# Patient Record
Sex: Male | Born: 1960
Health system: Southern US, Community
[De-identification: ages and names within clinical notes are randomized; demographics above are authoritative.]

## PROBLEM LIST (undated history)

## (undated) DIAGNOSIS — H269 Unspecified cataract: Secondary | ICD-10-CM

## (undated) DIAGNOSIS — E785 Hyperlipidemia, unspecified: Secondary | ICD-10-CM

## (undated) DIAGNOSIS — I10 Essential (primary) hypertension: Secondary | ICD-10-CM

## (undated) DIAGNOSIS — K219 Gastro-esophageal reflux disease without esophagitis: Secondary | ICD-10-CM

## (undated) HISTORY — PX: COLONOSCOPY: SHX174

## (undated) HISTORY — DX: Unspecified cataract: H26.9

## (undated) HISTORY — DX: Gastro-esophageal reflux disease without esophagitis: K21.9

## (undated) HISTORY — DX: Essential (primary) hypertension: I10

## (undated) HISTORY — PX: POLYPECTOMY: SHX149

## (undated) HISTORY — DX: Hyperlipidemia, unspecified: E78.5

---
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
## 1987-09-16 HISTORY — PX: WISDOM TOOTH EXTRACTION: SHX21

## 2005-02-09 ENCOUNTER — Emergency Department (HOSPITAL_COMMUNITY): Admission: EM | Admit: 2005-02-09 | Discharge: 2005-02-10 | Payer: Self-pay | Admitting: Emergency Medicine

## 2005-07-17 ENCOUNTER — Encounter: Payer: Self-pay | Admitting: Internal Medicine

## 2005-07-28 ENCOUNTER — Encounter: Payer: Self-pay | Admitting: Internal Medicine

## 2006-02-14 ENCOUNTER — Emergency Department: Payer: Self-pay | Admitting: Emergency Medicine

## 2006-02-14 ENCOUNTER — Other Ambulatory Visit: Payer: Self-pay

## 2006-02-17 ENCOUNTER — Emergency Department (HOSPITAL_COMMUNITY): Admission: EM | Admit: 2006-02-17 | Discharge: 2006-02-17 | Payer: Self-pay | Admitting: Family Medicine

## 2006-03-24 ENCOUNTER — Emergency Department (HOSPITAL_COMMUNITY): Admission: EM | Admit: 2006-03-24 | Discharge: 2006-03-24 | Payer: Self-pay | Admitting: Emergency Medicine

## 2006-06-11 ENCOUNTER — Ambulatory Visit: Payer: Self-pay | Admitting: Internal Medicine

## 2006-09-01 ENCOUNTER — Emergency Department (HOSPITAL_COMMUNITY): Admission: EM | Admit: 2006-09-01 | Discharge: 2006-09-01 | Payer: Self-pay | Admitting: Family Medicine

## 2006-09-15 ENCOUNTER — Emergency Department (HOSPITAL_COMMUNITY): Admission: EM | Admit: 2006-09-15 | Discharge: 2006-09-15 | Payer: Self-pay | Admitting: Family Medicine

## 2007-01-09 ENCOUNTER — Emergency Department (HOSPITAL_COMMUNITY): Admission: EM | Admit: 2007-01-09 | Discharge: 2007-01-09 | Payer: Self-pay | Admitting: Emergency Medicine

## 2007-06-30 ENCOUNTER — Emergency Department: Payer: Self-pay | Admitting: Emergency Medicine

## 2007-06-30 ENCOUNTER — Other Ambulatory Visit: Payer: Self-pay

## 2007-07-19 ENCOUNTER — Encounter (INDEPENDENT_AMBULATORY_CARE_PROVIDER_SITE_OTHER): Payer: Self-pay | Admitting: *Deleted

## 2007-07-20 ENCOUNTER — Ambulatory Visit: Payer: Self-pay | Admitting: Internal Medicine

## 2007-07-20 DIAGNOSIS — K219 Gastro-esophageal reflux disease without esophagitis: Secondary | ICD-10-CM

## 2007-07-20 DIAGNOSIS — I10 Essential (primary) hypertension: Secondary | ICD-10-CM | POA: Insufficient documentation

## 2007-07-20 DIAGNOSIS — E1140 Type 2 diabetes mellitus with diabetic neuropathy, unspecified: Secondary | ICD-10-CM | POA: Insufficient documentation

## 2007-07-20 DIAGNOSIS — E119 Type 2 diabetes mellitus without complications: Secondary | ICD-10-CM

## 2007-07-21 LAB — CONVERTED CEMR LAB
BUN: 11 mg/dL (ref 6–23)
Basophils Absolute: 0 10*3/uL (ref 0.0–0.1)
Calcium: 10.3 mg/dL (ref 8.4–10.5)
Cholesterol: 212 mg/dL (ref 0–200)
Creatinine,U: 93.6 mg/dL
Direct LDL: 147.2 mg/dL
Eosinophils Absolute: 0 10*3/uL (ref 0.0–0.6)
GFR calc Af Amer: 117 mL/min
HDL: 34.4 mg/dL — ABNORMAL LOW (ref >39.0)
Lymphocytes Relative: 40 % (ref 12.0–46.0)
MCHC: 33.6 g/dL (ref 30.0–36.0)
MCV: 94.7 fL (ref 78.0–100.0)
Microalb, Ur: 0.3 mg/dL (ref 0.0–1.9)
Monocytes Relative: 8.1 % (ref 3.0–11.0)
Neutro Abs: 2.4 10*3/uL (ref 1.4–7.7)
PSA: 0.84 ng/mL (ref 0.10–4.00)
Phosphorus: 3.5 mg/dL (ref 2.3–4.6)
Platelets: 262 10*3/uL (ref 150–400)
Potassium: 3.8 meq/L (ref 3.5–5.1)
RBC: 4.93 M/uL (ref 4.22–5.81)
Triglycerides: 186 mg/dL — ABNORMAL HIGH (ref 0–149)

## 2008-01-18 ENCOUNTER — Ambulatory Visit: Payer: Self-pay | Admitting: Internal Medicine

## 2008-01-18 DIAGNOSIS — E785 Hyperlipidemia, unspecified: Secondary | ICD-10-CM

## 2008-01-18 DIAGNOSIS — E782 Mixed hyperlipidemia: Secondary | ICD-10-CM | POA: Insufficient documentation

## 2008-02-08 ENCOUNTER — Ambulatory Visit: Payer: Self-pay | Admitting: Internal Medicine

## 2008-02-08 ENCOUNTER — Telehealth (INDEPENDENT_AMBULATORY_CARE_PROVIDER_SITE_OTHER): Payer: Self-pay | Admitting: *Deleted

## 2008-02-09 LAB — CONVERTED CEMR LAB
AST: 19 units/L (ref 0–37)
Albumin: 4.1 g/dL (ref 3.5–5.2)
Alkaline Phosphatase: 65 units/L (ref 39–117)
CO2: 29 meq/L (ref 19–32)
Calcium: 9.4 mg/dL (ref 8.4–10.5)
Cholesterol: 153 mg/dL (ref 0–200)
Creatinine, Ser: 0.9 mg/dL (ref 0.4–1.5)
GFR calc Af Amer: 117 mL/min
GFR calc non Af Amer: 97 mL/min
HDL: 27.6 mg/dL — ABNORMAL LOW (ref >39.0)
Hgb A1c MFr Bld: 5.8 % (ref 4.6–6.0)
LDL Cholesterol: 96 mg/dL (ref 0–99)
Sodium: 141 meq/L (ref 135–145)
Total CHOL/HDL Ratio: 5.5
Total Protein: 6.6 g/dL (ref 6.0–8.3)
Triglycerides: 149 mg/dL (ref 0–149)

## 2008-03-21 ENCOUNTER — Encounter: Payer: Self-pay | Admitting: Internal Medicine

## 2008-06-14 ENCOUNTER — Ambulatory Visit: Payer: Self-pay | Admitting: Family Medicine

## 2008-08-21 ENCOUNTER — Ambulatory Visit: Payer: Self-pay | Admitting: Internal Medicine

## 2008-08-22 LAB — CONVERTED CEMR LAB
ALT: 20 units/L (ref 0–53)
AST: 21 units/L (ref 0–37)
Alkaline Phosphatase: 64 units/L (ref 39–117)
BUN: 9 mg/dL (ref 6–23)
Basophils Absolute: 0 10*3/uL (ref 0.0–0.1)
Bilirubin, Direct: 0.1 mg/dL (ref 0.0–0.3)
CO2: 31 meq/L (ref 19–32)
Calcium: 9.6 mg/dL (ref 8.4–10.5)
Creatinine,U: 42.7 mg/dL
Glucose, Bld: 123 mg/dL — ABNORMAL HIGH (ref 70–99)
Hgb A1c MFr Bld: 5.9 % (ref 4.6–6.0)
Lymphocytes Relative: 42 % (ref 12.0–46.0)
MCHC: 33.8 g/dL (ref 30.0–36.0)
Monocytes Relative: 5.8 % (ref 3.0–12.0)
Neutrophils Relative %: 50.6 % (ref 43.0–77.0)
PSA: 0.8 ng/mL (ref 0.10–4.00)
Platelets: 241 10*3/uL (ref 150–400)
RDW: 12.3 % (ref 11.5–14.6)
Sodium: 139 meq/L (ref 135–145)
TSH: 0.83 microintl units/mL (ref 0.35–5.50)
Total Bilirubin: 0.7 mg/dL (ref 0.3–1.2)

## 2009-02-26 ENCOUNTER — Ambulatory Visit: Payer: Self-pay | Admitting: Internal Medicine

## 2009-02-26 LAB — CONVERTED CEMR LAB
Albumin: 4.2 g/dL (ref 3.5–5.2)
BUN: 14 mg/dL (ref 6–23)
Glucose, Bld: 138 mg/dL — ABNORMAL HIGH (ref 70–99)
Hgb A1c MFr Bld: 5.9 % (ref 4.6–6.5)
Phosphorus: 3.4 mg/dL (ref 2.3–4.6)
Potassium: 3.7 meq/L (ref 3.5–5.1)

## 2009-08-28 ENCOUNTER — Encounter: Payer: Self-pay | Admitting: Internal Medicine

## 2009-08-29 ENCOUNTER — Ambulatory Visit: Payer: Self-pay | Admitting: Internal Medicine

## 2009-08-31 LAB — CONVERTED CEMR LAB
Albumin: 4.4 g/dL (ref 3.5–5.2)
Chloride: 98 meq/L (ref 96–112)
Cholesterol: 178 mg/dL (ref 0–200)
Creatinine,U: 84.9 mg/dL
Eosinophils Relative: 0.7 % (ref 0.0–5.0)
GFR calc non Af Amer: 115.92 mL/min (ref >60)
HCT: 47.5 % (ref 39.0–52.0)
Hemoglobin: 16.3 g/dL (ref 13.0–17.0)
LDL Cholesterol: 116 mg/dL — ABNORMAL HIGH (ref 0–99)
Lymphs Abs: 2 10*3/uL (ref 0.7–4.0)
Monocytes Relative: 6.1 % (ref 3.0–12.0)
Neutro Abs: 2.9 10*3/uL (ref 1.4–7.7)
Potassium: 3.4 meq/L — ABNORMAL LOW (ref 3.5–5.1)
RBC: 5.05 M/uL (ref 4.22–5.81)
TSH: 0.76 microintl units/mL (ref 0.35–5.50)
Total Protein: 7.6 g/dL (ref 6.0–8.3)
Triglycerides: 123 mg/dL (ref 0.0–149.0)
VLDL: 24.6 mg/dL (ref 0.0–40.0)
WBC: 5.2 10*3/uL (ref 4.5–10.5)

## 2009-09-17 ENCOUNTER — Telehealth: Payer: Self-pay | Admitting: Internal Medicine

## 2010-03-05 ENCOUNTER — Ambulatory Visit: Payer: Self-pay | Admitting: Internal Medicine

## 2010-08-16 ENCOUNTER — Telehealth: Payer: Self-pay | Admitting: Internal Medicine

## 2010-08-30 ENCOUNTER — Ambulatory Visit: Payer: Self-pay | Admitting: Internal Medicine

## 2010-09-06 ENCOUNTER — Ambulatory Visit: Payer: Self-pay | Admitting: Internal Medicine

## 2010-09-10 LAB — CONVERTED CEMR LAB
ALT: 19 units/L (ref 0–53)
AST: 19 units/L (ref 0–37)
Albumin: 4.2 g/dL (ref 3.5–5.2)
Alkaline Phosphatase: 63 units/L (ref 39–117)
Basophils Relative: 0.4 % (ref 0.0–3.0)
Chloride: 100 meq/L (ref 96–112)
Cholesterol: 172 mg/dL (ref 0–200)
Creatinine,U: 64.5 mg/dL
Eosinophils Relative: 0.8 % (ref 0.0–5.0)
GFR calc non Af Amer: 130.34 mL/min (ref >60.00)
Hemoglobin: 16 g/dL (ref 13.0–17.0)
LDL Cholesterol: 117 mg/dL — ABNORMAL HIGH (ref 0–99)
Lymphocytes Relative: 41.4 % (ref 12.0–46.0)
MCHC: 34.6 g/dL (ref 30.0–36.0)
Microalb, Ur: 0.2 mg/dL (ref 0.0–1.9)
Monocytes Relative: 7.1 % (ref 3.0–12.0)
Neutro Abs: 2.8 10*3/uL (ref 1.4–7.7)
PSA: 0.81 ng/mL (ref 0.10–4.00)
Phosphorus: 3.6 mg/dL (ref 2.3–4.6)
Potassium: 3.9 meq/L (ref 3.5–5.1)
RBC: 4.86 M/uL (ref 4.22–5.81)
TSH: 0.67 microintl units/mL (ref 0.35–5.50)
Total Protein: 7 g/dL (ref 6.0–8.3)

## 2010-10-02 ENCOUNTER — Ambulatory Visit
Admission: RE | Admit: 2010-10-02 | Discharge: 2010-10-02 | Payer: Self-pay | Source: Home / Self Care | Attending: Family Medicine | Admitting: Family Medicine

## 2010-10-02 DIAGNOSIS — R51 Headache: Secondary | ICD-10-CM | POA: Insufficient documentation

## 2010-10-02 DIAGNOSIS — R519 Headache, unspecified: Secondary | ICD-10-CM | POA: Insufficient documentation
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
## 2010-10-15 NOTE — Progress Notes (Signed)
Summary: felt weak  Phone Note Call from Patient   Caller: Patient Summary of Call: Pt is in [place], said he had episode of feeling weak and nervous.  He is asking if he needs to be seen can he go to [facility], says he is right there at it.  I advised him no, because he is not a pt there.  If an emergency situation he can, but they would probably send him across the street to the hospital.  He says he is feeling better now, says he thought he just needed to eat something.  He will discuss this with you at his next office visit. Initial call taken by: Lowella Petties CMA, AAMA,  August 16, 2010 12:12 PM  Follow-up for Phone Call        okay  Please call him on Monday and make sure he is still feeling okay Cindee Salt MD  August 16, 2010 1:10 PM   left message on machine at home for patient to return my call.  DeShannon Smith CMA Duncan Dull)  August 19, 2010 9:37 AM   patient still not calling back, I will close the phone call and wait for pt to call. Follow-up by: Mervin Hack CMA (AAMA),  August 21, 2010 1:00 PM

## 2010-10-15 NOTE — Progress Notes (Signed)
Summary: Wants med refilled thru [facility]  Phone Note Call from Patient Call back at [phone]   Caller: Patient Call For: Cindee Salt MD Summary of Call: Pt had CPX one month ago and spoke with [facility] about getting set up with [facility] to get his meds refilled. Pt would like for [facility] to call him back at [phone] to talk about refilling his meds thru [facility]. Pt understands that Geraldine Contras is not in the office today and pt  is not out of med and said he would wait to hear back from [place]. Thank you. Initial call taken by: Lewanda Rife LPN,  September 17, 2009 9:51 AM  Follow-up for Phone Call        pt would like for all refills to be sent to [facility], which I have done. Follow-up by: Mervin Hack CMA Duncan Dull),  September 18, 2009 12:26 PM    Prescriptions: Koren Bound TEST   STRP (GLUCOSE BLOOD) check daily or as directed  #300 x 3   Entered by:   Mervin Hack CMA (AAMA)   Authorized by:   Cindee Salt MD   Signed by:   Mervin Hack CMA (AAMA) on 09/18/2009   Method used:   Electronically to        [facility] (mail-order)             ,          Ph: [phone]       Fax: [phone]   RxID:   [id number] PRILOSEC OTC 20 MG  TBEC (OMEPRAZOLE MAGNESIUM) Take 1 tablet by mouth once a day as needed  #90 x 3   Entered by:   Mervin Hack CMA (AAMA)   Authorized by:   Cindee Salt MD   Signed by:   Mervin Hack CMA (AAMA) on 09/18/2009   Method used:   Electronically to        [facility] (mail-order)             ,          Ph: [phone]       Fax: [phone]   RxID:   [id number] LISINOPRIL 40 MG  TABS (LISINOPRIL) Take 1 tablet by mouth once a day  #90 x 3   Entered by:   Mervin Hack CMA (AAMA)   Authorized by:   Cindee Salt MD   Signed by:   Mervin Hack CMA (AAMA) on 09/18/2009   Method used:   Electronically to        [facility]* (mail-order)             ,          Ph: [phone]       Fax: [phone]   RxID:    [id number] AMLODIPINE BESYLATE 10 MG TABS (AMLODIPINE BESYLATE) Take 1 tablet by mouth once a day  #90 x 3   Entered by:   Mervin Hack CMA (AAMA)   Authorized by:   Cindee Salt MD   Signed by:   Mervin Hack CMA (AAMA) on 09/18/2009   Method used:   Electronically to        [facility] (mail-order)             ,          Ph: [phone]       Fax: [phone]   RxID:   [id number] HYDROCHLOROTHIAZIDE 25 MG TABS (HYDROCHLOROTHIAZIDE) Take 1 tablet by mouth  once a day  #90 x 3   Entered by:   Mervin Hack CMA (AAMA)   Authorized by:   Cindee Salt MD   Signed by:   Mervin Hack CMA (AAMA) on 09/18/2009   Method used:   Electronically to        [facility]* (mail-order)             ,          Ph: [phone]       Fax: [phone]   RxID:   [id number]
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
## 2010-10-17 NOTE — Assessment & Plan Note (Signed)
Summary: cpx   Vital Signs:  Patient profile:   50 year old male Height:      75 inches Weight:      231 pounds Temp:     98.6 degrees F oral Pulse rate:   62 / minute Pulse rhythm:   regular BP sitting:   130 / 90  (left arm) Cuff size:   large  Vitals Entered By: Mervin Hack CMA Duncan Dull) (September 06, 2010 9:52 AM) CC: ADULT PHYSICAL   History of Present Illness: DOing fairly well BP generally okay---usually 120/80 didn't take meds today yet---fasting for labs  Checks sugars several times per week lately has been up to 110-120  due for eye exam  Is very attentive to small body symptoms Occ "jumping" feeling in left low back goes away on its own  Concerned about kidneys occ urine smells  Has had some mild headaches in the past few weeks every 2-3 days mostly in 1 temple---more left than right aspirin helps Mostly evening No neck pain  Allergies: No Known Drug Allergies  Past History:  Past medical, surgical, family and social histories (including risk factors) reviewed for relevance to current acute and chronic problems.  Past Medical History: Reviewed history from 01/18/2008 and no changes required. Diabetes mellitus, type II GERD Hypertension Hyperlipidemia  Past Surgical History: Reviewed history from 07/20/2007 and no changes required. Stress MRI---no ischemia, mild LVH   11/06  Family History: Reviewed history from 07/20/2007 and no changes required. 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 Eyes:  Denies double vision and vision loss-1 eye. ENT:  Denies decreased hearing and ringing in ears; teeth fine---regular with dentist. CV:  Complains of shortness of breath with exertion; denies chest pain or discomfort, difficulty breathing at night, difficulty breathing while lying down, fainting, lightheadness, and palpitations; Occ DOE walking fast and trying to talk. Resp:  Denies cough and shortness of breath. GI:  Complains of indigestion; denies abdominal pain, bloody stools, change in bowel habits, dark tarry stools, nausea, and vomiting; occ heartburn---needs OTC meds at times. GU:  Denies erectile dysfunction, urinary frequency, and urinary hesitancy. MS:  Complains of muscle aches; denies joint pain and joint swelling; occ stiffness in AM. Derm:  Denies lesion(s) and rash. Neuro:  Complains of headaches; denies numbness, tingling, and weakness. Psych:  Denies anxiety and depression; some stress--esp dealing with rental home. Heme:  Denies abnormal bruising and enlarge lymph nodes. Allergy:  Denies sneezing.  Physical Exam  General:  alert and normal appearance.   Eyes:  pupils equal, pupils round, pupils reactive to light, and no optic disk abnormalities.   Ears:  R ear normal and L ear normal.   Mouth:  no erythema, no exudates, and no lesions.   Neck:  supple, no masses, no thyromegaly, no carotid bruits, and no cervical lymphadenopathy.   Lungs:  normal respiratory effort, no intercostal retractions, no accessory muscle use, and normal breath sounds.   Heart:  normal rate, regular rhythm, no murmur, and no gallop.  Abdomen:  soft, non-tender, and no masses.   Msk:  no joint tenderness and no joint swelling.   Pulses:  1+ in feet Extremities:  no edema Neurologic:  alert & oriented X3, strength normal in all extremities, and gait normal.   Skin:  no suspicious lesions and no ulcerations.   Axillary Nodes:   No palpable lymphadenopathy Psych:  normally interactive, good eye contact, not anxious appearing, and not depressed appearing.    Diabetes Management Exam:    Foot Exam (with socks and/or shoes not present):       Sensory-Pinprick/Light touch:          Left medial foot (L-4): normal          Left dorsal foot (L-5): normal          Left lateral foot (S-1): normal          Right medial foot (L-4): normal          Right dorsal foot (L-5): normal          Right lateral foot (S-1): normal       Inspection:          Left foot: normal          Right foot: normal       Nails:          Left foot: normal          Right foot: normal   Impression & Recommendations:  Problem # 1:  PREVENTIVE HEALTH CARE (ICD-V70.0) Assessment Comment Only discussed walking more frequently will check PSA after discussion colon at 50  Problem # 2:  DIABETES MELLITUS, TYPE II (ICD-250.00) Assessment: Deteriorated  but still sounds okay will check labs  His updated medication list for this problem includes:    Lisinopril 40 Mg Tabs (Lisinopril) .Marland Kitchen... Take 1 tablet by mouth once a day  Labs Reviewed: Creat: 0.9 (08/29/2009)     Last Eye Exam: normal (05/16/2008) Reviewed HgBA1c results: 6.2 (03/05/2010)  6.0 (08/29/2009)  Orders: TLB-A1C / Hgb A1C (Glycohemoglobin) (83036-A1C) TLB-Microalbumin/Creat Ratio, Urine (82043-MALB)  Problem # 3:  HYPERTENSION (ICD-401.9) Assessment: Unchanged  fair control didn't take meds today  His updated medication list for this problem includes:    Hydrochlorothiazide 25 Mg Tabs (Hydrochlorothiazide) .Marland Kitchen... Take 1 tablet by mouth once a day    Amlodipine Besylate 10 Mg Tabs (Amlodipine besylate) .Marland Kitchen... Take 1 tablet by mouth once a day    Lisinopril 40 Mg Tabs (Lisinopril) .Marland Kitchen... Take 1 tablet by mouth once a day  BP today: 130/90 Prior BP: 148/88 (03/05/2010)  Labs Reviewed: K+: 3.4 (08/29/2009) Creat: : 0.9 (08/29/2009)   Chol: 178 (08/29/2009)   HDL:  37.80 (08/29/2009)   LDL: 116 (08/29/2009)   TG: 123.0 (08/29/2009)  Orders: TLB-Renal Function Panel (80069-RENAL) TLB-CBC Platelet - w/Differential (85025-CBCD) TLB-Hepatic/Liver Function Pnl (80076-HEPATIC) TLB-TSH (Thyroid Stimulating Hormone) (84443-TSH)  Problem # 4:  HYPERLIPIDEMIA (ICD-272.4) Assessment: Unchanged  check labs no Rx for now but should consider if LDL >130  Labs Reviewed: SGOT: 22 (08/29/2009)   SGPT: 22 (08/29/2009)   HDL:37.80 (08/29/2009), 27.6 (02/08/2008)  LDL:116 (08/29/2009), 96 (19/14/7829)  Chol:178 (08/29/2009), 153 (02/08/2008)  Trig:123.0 (08/29/2009), 149 (02/08/2008)  Orders: TLB-Lipid Panel (80061-LIPID) Venipuncture (56213)  Problem # 5:  GERD (ICD-530.81) Assessment: Unchanged  His updated medication list for this problem includes:    Prilosec Otc 20 Mg Tbec (Omeprazole magnesium) .Marland Kitchen... Take 1 tablet by mouth once a day as needed  Complete Medication List: 1)  Hydrochlorothiazide 25 Mg Tabs (Hydrochlorothiazide) .... Take 1 tablet by mouth once a day 2)  Amlodipine Besylate 10 Mg Tabs (Amlodipine besylate) .... Take 1 tablet by mouth once a day 3)  Lisinopril 40 Mg Tabs (Lisinopril) .... Take 1 tablet by mouth once a day 4)  Prilosec Otc 20 Mg Tbec (Omeprazole magnesium) .... Take 1 tablet by mouth once a day as needed 5)  Onetouch Ultra Test Strp (Glucose blood) .... Check daily or as directed  Other Orders: TLB-PSA (Prostate Specific Antigen) (84153-PSA)  Patient Instructions: 1)  Please schedule a follow-up appointment in 6 months .  Prescriptions: LISINOPRIL 40 MG  TABS (LISINOPRIL) Take 1 tablet by mouth once a day  #90 x 3   Entered by:   Mervin Hack CMA (AAMA)   Authorized by:   Cindee Salt MD   Signed by:   Mervin Hack CMA (AAMA) on 09/06/2010   Method used:   Electronically to        [facility] (retail)             ,          Ph: [phone]       Fax: [phone]   RxID:    [id number] AMLODIPINE BESYLATE 10 MG TABS (AMLODIPINE BESYLATE) Take 1 tablet by mouth once a day  #90 x 3   Entered by:   Mervin Hack CMA (AAMA)   Authorized by:   Cindee Salt MD   Signed by:   Mervin Hack CMA (AAMA) on 09/06/2010   Method used:   Electronically to        [facility] (retail)             ,          Ph: [phone]       Fax: [phone]   RxID:   [id number] HYDROCHLOROTHIAZIDE 25 MG TABS (HYDROCHLOROTHIAZIDE) Take 1 tablet by mouth once a day  #90 x 3   Entered by:   Mervin Hack CMA (AAMA)   Authorized by:   Cindee Salt MD   Signed by:   Mervin Hack CMA (AAMA) on 09/06/2010   Method used:   Electronically to        [facility]* (retail)             ,          Ph: [phone]       Fax: [phone]   RxID:   [id number]    Orders Added: 1)  Est. Patient 40-64 years [99396] 2)  TLB-A1C / Hgb A1C (Glycohemoglobin) [83036-A1C] 3)  TLB-Microalbumin/Creat Ratio, Urine [82043-MALB] 4)  TLB-PSA (Prostate Specific Antigen) [84153-PSA] 5)  TLB-Lipid Panel [80061-LIPID] 6)  Venipuncture [36415] 7)  TLB-Renal Function Panel [80069-RENAL] 8)  TLB-CBC Platelet - w/Differential [85025-CBCD] 9)  TLB-Hepatic/Liver Function Pnl [80076-HEPATIC] 10)  TLB-TSH (Thyroid Stimulating Hormone) [25427-CWC]    Current Allergies (reviewed today): No known allergies

## 2010-11-08 ENCOUNTER — Ambulatory Visit: Payer: Self-pay | Admitting: Internal Medicine

## 2011-01-31 NOTE — Assessment & Plan Note (Signed)
[facility]                                   ON-CALL NOTE   LENNIS, KORB                        MRN:          [id number]  DATE:06/22/2006                            DOB:          05/18/1961    IDENTIFICATION:  Phone dictation on Mr. Sweetin, date of birth November 04, 1960, one of my patients, phone number [phone].  Phone call on June 22, 2006, at about 1835 hours.   SUBJECTIVE:  Mr. Branscome calls, says there was a mix-up, did not get his  prescriptions phoned into [facility] and he is right now for his refills.  I do  not remember him.  I think he is a new patient.  The medications he needed  were lisinopril 40, hydrochlorothiazide 25 and Norvasc 10.   PLAN:  I did call the pharmacist at [facility], and okayed 1 month refills on  each of those, and will straighten out any further medications during  regular business hours.            ______________________________  Karie Schwalbe, MD      RIL/MedQ  DD:  06/22/2006  DT:  06/24/2006  Job #:  [id number]

## 2011-01-31 NOTE — Assessment & Plan Note (Signed)
[facility]                                 ON-CALL NOTE   JABIER, DEESE                          MRN:          [id number]  DATE:01/09/2007                            DOB:          [birth year]    The patient states that he has had some left upper back pain for about 2  weeks but it was worse this morning without specific injury, otherwise  chest pain, shortness of breath, fever, or cough but was concerned that  should be checked out.  I have offered office visit in [place] office this  morning, however patient states that he uses [place] as secondary and he  usually sees Dr. Milus Glazier at [facility] so he will be seen  at [facility] this morning.     Neta Mends. Panosh, MD  Electronically Signed    WKP/MedQ  DD: 01/09/2007  DT: 01/09/2007  Job #: [id number]

## 2011-03-07 ENCOUNTER — Ambulatory Visit: Payer: Self-pay | Admitting: Internal Medicine

## 2011-03-12 ENCOUNTER — Encounter: Payer: Self-pay | Admitting: Internal Medicine

## 2011-03-14 ENCOUNTER — Ambulatory Visit (INDEPENDENT_AMBULATORY_CARE_PROVIDER_SITE_OTHER): Payer: 59 | Admitting: Internal Medicine

## 2011-03-14 ENCOUNTER — Encounter: Payer: Self-pay | Admitting: Internal Medicine

## 2011-03-14 VITALS — BP 122/80 | HR 85 | Temp 98.6°F | Ht 74.5 in | Wt 223.0 lb

## 2011-03-14 DIAGNOSIS — K219 Gastro-esophageal reflux disease without esophagitis: Secondary | ICD-10-CM

## 2011-03-14 DIAGNOSIS — I10 Essential (primary) hypertension: Secondary | ICD-10-CM

## 2011-03-14 DIAGNOSIS — E119 Type 2 diabetes mellitus without complications: Secondary | ICD-10-CM

## 2011-03-14 DIAGNOSIS — E785 Hyperlipidemia, unspecified: Secondary | ICD-10-CM

## 2011-03-14 LAB — BASIC METABOLIC PANEL
Calcium: 9.7 mg/dL (ref 8.4–10.5)
GFR: 153.92 mL/min (ref >60.00)
Potassium: 3.9 mEq/L (ref 3.5–5.1)
Sodium: 141 mEq/L (ref 135–145)

## 2011-03-14 LAB — LIPID PANEL
LDL Cholesterol: 116 mg/dL — ABNORMAL HIGH (ref 0–99)
VLDL: 28.4 mg/dL (ref 0.0–40.0)

## 2011-03-14 NOTE — Assessment & Plan Note (Signed)
BP Readings from Last 3 Encounters:  03/14/11 122/80  10/02/10 112/78  09/06/10 130/90   Good control No changes needed

## 2011-03-14 NOTE — Progress Notes (Signed)
Subjective:    Patient ID: Hunter Richmond, male    DOB: 01-27-1961, 50 y.o.   MRN: [id number]  HPI Stress has died down with sister in law---no longer a dangerous situation No headaches any more  Has not been exercising lately Related to long hours at work---trying to change job if possible. 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 amLODipine (NORVASC) 10 MG tablet Take 10 mg by mouth daily.        Marland Kitchen glucose blood (ONE TOUCH ULTRA TEST) test strip 1 each daily as needed. Use as instructed       . hydrochlorothiazide 25 MG tablet Take 25 mg by mouth daily.        Marland Kitchen lisinopril (PRINIVIL,ZESTRIL) 40 MG tablet Take 40 mg by mouth daily.        Marland Kitchen omeprazole (PRILOSEC) 20 MG capsule Take 20 mg by mouth daily.          No Known Allergies  Past Medical History  Diagnosis Date  . Diabetes mellitus   . GERD (gastroesophageal reflux disease)   . Hypertension   . Hyperlipidemia     No past surgical history on file.  Family History  Problem Relation Age of Onset  . Diabetes Brother   . Cancer Maternal Aunt     breast cancer  . Cancer Maternal Uncle     throat  . Heart disease Neg Hx   . Diabetes Sister     History   Social History  . Marital Status: Married    Spouse Name: N/A    Number of Children: 1  . 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 Smoking status: Never Smoker   . Smokeless tobacco: Never  Used  . Alcohol Use: No  . Drug Use: No  . Sexually Active: Not on file   Other Topics Concern  . Not on file   Social History Narrative   Occupation: Charity fundraiser, owns 1 rental housePart time substitute teacherSeventh Day Adventist--converting to vegetarianExercises regularly--walks on treadmillDoesn't know father   Review of Systems Has been meat free for 6 months Mostly protein with beans Sleeps okay Weight is fairly stable Occ notes skipped beat at rest Occ "jiggle" feeling in left proximal forearm    Objective:   Physical Exam  Constitutional: He appears well-developed and well-nourished. No distress.  Neck: Normal range of motion. Neck supple. No thyromegaly present.  Cardiovascular: Normal rate, regular rhythm, normal heart sounds and intact distal pulses.  Exam reveals no gallop.   No murmur heard. Pulmonary/Chest: Effort normal and breath sounds normal. No respiratory distress. He has no wheezes. He has no rales.  Abdominal: Soft. There is no tenderness.  Musculoskeletal: Normal range of motion. He exhibits no edema and no tenderness.  Lymphadenopathy:    He has no cervical adenopathy.  Neurological:       Normal sensation in  feet  Skin: Skin is warm. No rash noted.  Psychiatric: He has a normal mood and affect. His behavior is normal. Judgment and thought content normal.          Assessment & Plan:

## 2011-03-14 NOTE — Assessment & Plan Note (Signed)
Still seems to have good control Discussed exercise Lab Results  Component Value Date   HGBA1C 6.5 09/06/2010

## 2011-03-14 NOTE — Assessment & Plan Note (Signed)
Uses prilosec intermittently Discussed this

## 2011-03-14 NOTE — Assessment & Plan Note (Signed)
Discussed goals We agree to a goal of LDL under 100 Will consider Rx if no lower than the 117 on the vegetarian diet

## 2011-03-18 ENCOUNTER — Telehealth: Payer: Self-pay | Admitting: *Deleted

## 2011-03-18 NOTE — Telephone Encounter (Signed)
Message copied by Sueanne Margarita on Tue Mar 18, 2011  3:10 PM ------      Message from: Tillman Abide I      Created: Sun Mar 16, 2011 11:00 AM       Please call      Diabetes control is still excellent with HgbA1c of 6.5%      Chol is about the same with total of 182 and LDL or bad chol of 116. As we discussed, we will therefore start low dose chol med. Please send Rx for pravastatin 20mg  daily (1 year Rx). Schedule lipid and hepatic profile in 4-6 weeks      Kidney tests are normal

## 2011-03-18 NOTE — Telephone Encounter (Signed)
.  left message to have patient return my call.  

## 2011-03-20 MED ORDER — PRAVASTATIN SODIUM 20 MG PO TABS: 20.0000 mg | ORAL_TABLET | Freq: Every day | ORAL | Status: DC

## 2011-03-20 NOTE — Telephone Encounter (Signed)
Spoke with patient and advised results, patient will also be starting OTC vitamin b-12 and would like his b-12 checked with his lipids in 6 weeks. rx for pravastatin sent to [facility].

## 2011-03-20 NOTE — Telephone Encounter (Signed)
Okay to add B12 to his blood work Try 530.81 as diagnosis

## 2011-03-21 NOTE — Telephone Encounter (Signed)
Patient will call for lab appt

## 2011-05-23 ENCOUNTER — Other Ambulatory Visit (INDEPENDENT_AMBULATORY_CARE_PROVIDER_SITE_OTHER): Payer: 59

## 2011-05-23 DIAGNOSIS — E7800 Pure hypercholesterolemia, unspecified: Secondary | ICD-10-CM

## 2011-05-23 LAB — LIPID PANEL
Cholesterol: 128 mg/dL (ref 0–200)
HDL: 41 mg/dL (ref >39.00)
VLDL: 17.8 mg/dL (ref 0.0–40.0)

## 2011-05-23 LAB — HEPATIC FUNCTION PANEL
ALT: 18 U/L (ref 0–53)
AST: 20 U/L (ref 0–37)
Bilirubin, Direct: 0.2 mg/dL (ref 0.0–0.3)
Total Protein: 7.3 g/dL (ref 6.0–8.3)

## 2011-07-23 ENCOUNTER — Ambulatory Visit (INDEPENDENT_AMBULATORY_CARE_PROVIDER_SITE_OTHER): Payer: 59 | Admitting: Internal Medicine

## 2011-07-23 ENCOUNTER — Encounter: Payer: Self-pay | Admitting: Internal Medicine

## 2011-07-23 VITALS — BP 133/71 | HR 67 | Temp 98.6°F | Ht 74.0 in | Wt 224.0 lb

## 2011-07-23 DIAGNOSIS — S90929A Unspecified superficial injury of unspecified foot, initial encounter: Secondary | ICD-10-CM | POA: Insufficient documentation

## 2011-07-23 DIAGNOSIS — Z23 Encounter for immunization: Secondary | ICD-10-CM

## 2011-07-23 NOTE — Progress Notes (Signed)
  Subjective:    Patient ID: Hunter Richmond, male    DOB: October 04, 1960, 50 y.o.   MRN: [id number]  HPI Having trouble with right foot Noted scar on plantar surface Remembers a pebble in his shoe and might have been injured then--3-4 days ago Feels occ pinch but no sig pain at all  No redness No fever  Current Outpatient Prescriptions on File Prior to Visit  Medication Sig Dispense Refill  . amLODipine (NORVASC) 10 MG tablet Take 10 mg by mouth daily.        Marland Kitchen glucose blood (ONE TOUCH ULTRA TEST) test strip 1 each daily as needed. Use as instructed       . hydrochlorothiazide 25 MG tablet Take 25 mg by mouth daily.        Marland Kitchen lisinopril (PRINIVIL,ZESTRIL) 40 MG tablet Take 40 mg by mouth daily.        Marland Kitchen omeprazole (PRILOSEC) 20 MG capsule Take 20 mg by mouth daily.        . pravastatin (PRAVACHOL) 20 MG tablet Take 1 tablet (20 mg total) by mouth daily.  90 tablet  3    No Known Allergies  Past Medical History  Diagnosis Date  . Diabetes mellitus   . GERD (gastroesophageal reflux disease)   . Hypertension   . Hyperlipidemia     No past surgical history on file.  Family History  Problem Relation Age of Onset  . Diabetes Brother   . Cancer Maternal Aunt     breast cancer  . Cancer Maternal Uncle     throat  . Heart disease Neg Hx   . Diabetes Sister     History   Social History  . Marital Status: Married    Spouse Name: N/A    Number of Children: 1  . 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 Smoking status: Never Smoker   . Smokeless tobacco: Never Used  . Alcohol Use: No  . Drug Use: No  . Sexually Active: Not on file   Other Topics Concern  . Not on file   Social History Narrative   Occupation: Charity fundraiser, owns 1 rental housePart time substitute teacherSeventh Day Adventist--converting to vegetarianExercises regularly--walks on treadmillDoesn't know father   Review  of Systems Feels well No troubles with the chol med     Objective:   Physical Exam  Constitutional: He appears well-developed and well-nourished. No distress.  Cardiovascular: Intact distal pulses.   Skin:       Linear hypertrophic skin response longitudinally along plantar right foot No skin break or inflammation          Assessment & Plan:

## 2011-07-23 NOTE — Assessment & Plan Note (Signed)
Minor injury No signs of infection reassured

## 2011-09-08 ENCOUNTER — Telehealth: Payer: Self-pay | Admitting: *Deleted

## 2011-09-08 MED ORDER — AMLODIPINE BESYLATE 10 MG PO TABS: 10.0000 mg | ORAL_TABLET | Freq: Every day | ORAL | Status: DC

## 2011-09-08 MED ORDER — LISINOPRIL 40 MG PO TABS: 40.0000 mg | ORAL_TABLET | Freq: Every day | ORAL | Status: DC

## 2011-09-08 MED ORDER — HYDROCHLOROTHIAZIDE 25 MG PO TABS: 25.0000 mg | ORAL_TABLET | Freq: Every day | ORAL | Status: DC

## 2011-09-08 NOTE — Telephone Encounter (Signed)
Opened in error

## 2011-09-19 ENCOUNTER — Encounter: Payer: 59 | Admitting: Internal Medicine

## 2011-10-08 ENCOUNTER — Encounter: Payer: 59 | Admitting: Internal Medicine

## 2011-11-04 ENCOUNTER — Other Ambulatory Visit: Payer: Self-pay | Admitting: Internal Medicine

## 2011-11-04 NOTE — Telephone Encounter (Signed)
Pt is calling to schedule a CPE but he will run out of pills before his appt in June. He was wondering if he could get some refilled. He uses [facility]. He needs Lisinopril and Hydrochlorothyazide refilled. He needs 90 day refills. He had a weeks worth left of each.

## 2011-11-05 ENCOUNTER — Ambulatory Visit (INDEPENDENT_AMBULATORY_CARE_PROVIDER_SITE_OTHER): Payer: 59 | Admitting: Family Medicine

## 2011-11-05 ENCOUNTER — Telehealth: Payer: Self-pay | Admitting: Internal Medicine

## 2011-11-05 ENCOUNTER — Ambulatory Visit (INDEPENDENT_AMBULATORY_CARE_PROVIDER_SITE_OTHER)
Admission: RE | Admit: 2011-11-05 | Discharge: 2011-11-05 | Disposition: A | Discharge status: Home / Self Care | Payer: 59 | Source: Ambulatory Visit | Attending: Family Medicine | Admitting: Family Medicine

## 2011-11-05 ENCOUNTER — Encounter: Payer: Self-pay | Admitting: Family Medicine

## 2011-11-05 VITALS — BP 118/80 | HR 64 | Temp 97.6°F | Ht 74.0 in | Wt 225.2 lb

## 2011-11-05 DIAGNOSIS — M25519 Pain in unspecified shoulder: Secondary | ICD-10-CM

## 2011-11-05 DIAGNOSIS — M25512 Pain in left shoulder: Secondary | ICD-10-CM

## 2011-11-05 MED ORDER — HYDROCHLOROTHIAZIDE 25 MG PO TABS: 25.0000 mg | ORAL_TABLET | Freq: Every day | ORAL | Status: DC

## 2011-11-05 MED ORDER — AMLODIPINE BESYLATE 10 MG PO TABS: 10.0000 mg | ORAL_TABLET | Freq: Every day | ORAL | Status: DC

## 2011-11-05 MED ORDER — LISINOPRIL 40 MG PO TABS: 40.0000 mg | ORAL_TABLET | Freq: Every day | ORAL | Status: DC

## 2011-11-05 NOTE — Progress Notes (Signed)
Subjective:    Patient ID: Hunter Richmond, male    DOB: [birth year], 51 y.o.   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 DIABETES MELLITUS, TYPE II  . HYPERLIPIDEMIA  . HYPERTENSION  . GERD  . HEADACHE  . Superficial injury of foot  . Left shoulder pain   Past Medical History  Diagnosis Date  . Diabetes mellitus   . GERD (gastroesophageal reflux disease)   . Hypertension   . Hyperlipidemia    No past surgical history on file. History  Substance Use Topics  . Smoking status: Never Smoker   . Smokeless tobacco: Never Used  . Alcohol Use: No   Family History  Problem Relation Age of Onset  . Diabetes Brother   . Cancer Maternal Aunt     breast cancer  . Cancer Maternal Uncle     throat  . Heart disease Neg Hx   . Diabetes Sister    No Known Allergies Current Outpatient Prescriptions on File Prior to Visit  Medication Sig Dispense Refill  . amLODipine (NORVASC) 10 MG tablet Take 1 tablet (10 mg total) by mouth daily.  90 tablet  3  . glucose blood (ONE TOUCH ULTRA TEST) test strip 1 each daily as needed. Use as instructed       . hydrochlorothiazide (HYDRODIURIL) 25 MG tablet Take 1 tablet (25 mg total) by mouth daily.  90 tablet  3  . lisinopril (PRINIVIL,ZESTRIL) 40 MG tablet Take 1 tablet (40 mg total) by mouth daily.  90 tablet  3  . pravastatin (PRAVACHOL) 20 MG  tablet Take 1 tablet (20 mg total) by mouth daily.  90 tablet  3  . omeprazole (PRILOSEC) 20 MG capsule Take 20 mg by mouth daily.            Review of Systems Review of Systems  Constitutional: Negative for fever, appetite change, fatigue and unexpected weight change.  Eyes: Negative for pain and visual disturbance.  Respiratory: Negative for cough and shortness of breath.   Cardiovascular: Negative for cp or palpitations    Gastrointestinal: Negative for nausea, diarrhea and constipation.  Genitourinary: Negative for urgency and frequency.  Skin: Negative for pallor or rash   MSk pos for shoulder pain, neg for joint swelling or redness Neurological: Negative for weakness, light-headedness, numbness and headaches.  Hematological: Negative for adenopathy. Does not bruise/bleed easily.  Psychiatric/Behavioral: Negative for dysphoric mood. The patient is not nervous/anxious.          Objective:   Physical Exam  Constitutional: He appears well-developed and well-nourished. No distress.  HENT:  Head: Normocephalic and atraumatic.  Eyes: Conjunctivae and EOM are normal. Pupils are equal, round, and reactive  to light.  Neck: Normal range of motion and full passive range of motion without pain. Neck supple. No JVD present. No spinous process tenderness and no muscular tenderness present. No edema and normal range of motion present. No thyromegaly present.  Cardiovascular: Normal rate and regular rhythm.   Pulmonary/Chest: Effort normal and breath sounds normal.  Musculoskeletal: Normal range of motion. He exhibits tenderness. He exhibits no edema.       Left shoulder: He exhibits tenderness and bony tenderness. He exhibits normal range of motion, no swelling, no effusion, no crepitus, no deformity, no spasm, normal pulse and normal strength.       L shoulder Mild acromion tenderness Nl rom passive and active Mild bicep tendon tenderness Neg hawking and neer tests Nl int/ ext rotation  without pain   Lymphadenopathy:    He has no cervical adenopathy.  Neurological: He is alert. He has normal strength and normal reflexes. He displays no atrophy. No sensory deficit. He exhibits normal muscle tone. Coordination normal.  Skin: Skin is warm and dry. No rash noted. No erythema.  Psychiatric: He has a normal mood and affect.          Assessment & Plan:

## 2011-11-05 NOTE — Telephone Encounter (Signed)
Spoke with patient and advised results rx sent to pharmacy by e-script  

## 2011-11-05 NOTE — Assessment & Plan Note (Signed)
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

## 2011-11-05 NOTE — Patient Instructions (Signed)
Xray today on the way out  If your shoulder hurts - try 10 minutes of ice followed by heat  Avoid very heavy lifting but stay active Will update you after I get the radiology report

## 2011-11-05 NOTE — Telephone Encounter (Signed)
Triage Record Num: [id number] Operator: Peri Jefferson Patient Name: Hunter Richmond Call Date & Time: 11/04/2011 4:30:27PM Patient Phone: [phone] PCP: Tillman Abide Patient Gender: Male PCP Fax : [phone] Patient DOB: May 01, 1961 Practice Name: [facility] Reason for Call: Caller: [name]/Patient; PCP: Tillman Abide I.; CB#: [phone]; Call regarding Pain in Shoulder/Collar Bone Area; Ivar Drape requesting an appt for tomorrow. States that he has been having shoulder/collar bone pain since approx 10/04/11. Pain is intermittent. Denies edema. Rates pain 2/10. Utilized Shoulder Non-Injury Guideline. See PCP within 24 hrs disposition. Scheduled appt for tomorrow (11/05/11) @ 1615 with Dr. Milinda Antis. Parameters reviewed concerning when to call back. Protocol(s) Used: Shoulder Non-Injury Recommended Outcome per Protocol: See Provider within 24 hours Reason for Outcome: New onset mild to moderate pain that has not improved with 24 hours of home care Care Advice: ~ Call provider if symptoms worsen or new symptoms develop. Apply cloth-covered ice pack or a cool compress to the area for no more than 20 minutes 4-8 times a day while awake to reduce pain and swelling. ~ ~ Avoid activity that causes or worsens symptoms. ~ SYMPTOM / CONDITION MANAGEMENT 11/04/2011 4:41:04PM Page 1 of 1 [email or web address]

## 2011-12-26 ENCOUNTER — Telehealth: Payer: Self-pay

## 2011-12-26 NOTE — Telephone Encounter (Signed)
Pt left v/m request order for tb skin test needed for work. Pt can be reached at [phone] for scheduling.

## 2011-12-27 NOTE — Telephone Encounter (Signed)
Please arrange this for him

## 2011-12-29 NOTE — Telephone Encounter (Signed)
Left message that pt can call to schedule a nurse visit for PPD skin test

## 2011-12-30 ENCOUNTER — Ambulatory Visit (INDEPENDENT_AMBULATORY_CARE_PROVIDER_SITE_OTHER): Payer: 59 | Admitting: *Deleted

## 2011-12-30 DIAGNOSIS — Z111 Encounter for screening for respiratory tuberculosis: Secondary | ICD-10-CM

## 2012-01-02 LAB — TB SKIN TEST: TB Skin Test: NEGATIVE mm

## 2012-01-30 ENCOUNTER — Telehealth: Payer: Self-pay

## 2012-01-30 NOTE — Telephone Encounter (Signed)
Pt applying for new employment and last thing needed is current A1C. Pt said needs to get test ASAP. Pt said he does not need a form he only needs lab result. Pt last seen 11/05/11. Pt request call back at [phone].

## 2012-01-30 NOTE — Telephone Encounter (Signed)
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

## 2012-01-30 NOTE — Telephone Encounter (Signed)
He is due for physical next month Might as well get all labs so we don't have to do more in June  HgbA1c, urine microal, TSH, CBC with diff, met B, lipid, hepatic-- 250.00 PSA  V76.44

## 2012-02-04 ENCOUNTER — Other Ambulatory Visit: Payer: Self-pay | Admitting: Internal Medicine

## 2012-02-04 DIAGNOSIS — E119 Type 2 diabetes mellitus without complications: Secondary | ICD-10-CM

## 2012-02-04 DIAGNOSIS — Z125 Encounter for screening for malignant neoplasm of prostate: Secondary | ICD-10-CM

## 2012-02-10 ENCOUNTER — Other Ambulatory Visit (INDEPENDENT_AMBULATORY_CARE_PROVIDER_SITE_OTHER): Payer: 59

## 2012-02-10 ENCOUNTER — Encounter: Payer: Self-pay | Admitting: Family Medicine

## 2012-02-10 ENCOUNTER — Ambulatory Visit (INDEPENDENT_AMBULATORY_CARE_PROVIDER_SITE_OTHER): Payer: 59 | Admitting: Family Medicine

## 2012-02-10 VITALS — BP 120/88 | HR 70 | Temp 98.7°F | Ht 74.0 in | Wt 221.1 lb

## 2012-02-10 DIAGNOSIS — Z125 Encounter for screening for malignant neoplasm of prostate: Secondary | ICD-10-CM

## 2012-02-10 DIAGNOSIS — E119 Type 2 diabetes mellitus without complications: Secondary | ICD-10-CM

## 2012-02-10 DIAGNOSIS — J069 Acute upper respiratory infection, unspecified: Secondary | ICD-10-CM | POA: Insufficient documentation

## 2012-02-10 LAB — MICROALBUMIN / CREATININE URINE RATIO
Creatinine,U: 117 mg/dL
Microalb Creat Ratio: 0.2 mg/g (ref 0.0–30.0)
Microalb, Ur: 0.2 mg/dL (ref 0.0–1.9)

## 2012-02-10 LAB — LIPID PANEL
Total CHOL/HDL Ratio: 4
VLDL: 21.8 mg/dL (ref 0.0–40.0)

## 2012-02-10 LAB — PSA: PSA: 0.85 ng/mL (ref 0.10–4.00)

## 2012-02-10 LAB — CBC WITH DIFFERENTIAL/PLATELET
Basophils Absolute: 0 10*3/uL (ref 0.0–0.1)
Eosinophils Relative: 0.7 % (ref 0.0–5.0)
Lymphocytes Relative: 26.1 % (ref 12.0–46.0)
Monocytes Relative: 7.7 % (ref 3.0–12.0)
Neutrophils Relative %: 65.1 % (ref 43.0–77.0)
Platelets: 256 10*3/uL (ref 150.0–400.0)
RDW: 12.6 % (ref 11.5–14.6)
WBC: 7.8 10*3/uL (ref 4.5–10.5)

## 2012-02-10 LAB — BASIC METABOLIC PANEL
Calcium: 9.5 mg/dL (ref 8.4–10.5)
GFR: 161.31 mL/min (ref >60.00)
Glucose, Bld: 128 mg/dL — ABNORMAL HIGH (ref 70–99)
Sodium: 138 mEq/L (ref 135–145)

## 2012-02-10 LAB — HEPATIC FUNCTION PANEL
AST: 19 U/L (ref 0–37)
Albumin: 4 g/dL (ref 3.5–5.2)
Total Bilirubin: 1 mg/dL (ref 0.3–1.2)

## 2012-02-10 LAB — HEMOGLOBIN A1C: Hgb A1c MFr Bld: 6 % (ref 4.6–6.5)

## 2012-02-10 LAB — TSH: TSH: 0.73 u[IU]/mL (ref 0.35–5.50)

## 2012-02-10 NOTE — Patient Instructions (Signed)
Call if not continuing to improve in next 4-5 days.

## 2012-02-10 NOTE — Progress Notes (Signed)
  Subjective:    Patient ID: LEJON AFZAL, male    DOB: 05-21-1961, 51 y.o.   MRN: [id number]  URI  This is a recurrent (Feels like got better then recured from one weekend to the next.) problem. The current episode started 1 to 4 weeks ago. The problem has been gradually improving. Maximum temperature: subjective fever. Associated symptoms include coughing, diarrhea, rhinorrhea and a sore throat. Pertinent negatives include no abdominal pain, chest pain, ear pain, rash, sinus pain or wheezing. Associated symptoms comments: No SOB  cough occ keeping up at night.. Treatments tried: theraflu, alkjaseltzer, nyquil. The treatment provided mild relief.   Has been taking zyrtec. Helps sneezing etc.   Non smoker, does have DM, well controlled. Lab Results  Component Value Date   HGBA1C 6.5 03/14/2011      Review of Systems  HENT: Positive for sore throat and rhinorrhea. Negative for ear pain.   Respiratory: Positive for cough. Negative for wheezing.   Cardiovascular: Negative for chest pain.  Gastrointestinal: Positive for diarrhea. Negative for abdominal pain.  Skin: Negative for rash.       Objective:   Physical Exam  Constitutional: Vital signs are normal. He appears well-developed and well-nourished.  Non-toxic appearance. He does not appear ill. No distress.  HENT:  Head: Normocephalic and atraumatic.  Right Ear: Hearing, tympanic membrane, external ear and ear canal normal. No tenderness. No foreign bodies. Tympanic membrane is not retracted and not bulging.  Left Ear: Hearing, tympanic membrane, external ear and ear canal normal. No tenderness. No foreign bodies. Tympanic membrane is not retracted and not bulging.  Nose: Mucosal edema and rhinorrhea present. Right sinus exhibits no maxillary sinus tenderness and no frontal sinus tenderness. Left sinus exhibits no maxillary sinus tenderness and no frontal sinus tenderness.  Mouth/Throat: Uvula is midline and mucous membranes are  normal. Normal dentition. No dental caries. Posterior oropharyngeal edema present. No oropharyngeal exudate or tonsillar abscesses.  Eyes: Conjunctivae, EOM and lids are normal. Pupils are equal, round, and reactive to light. No foreign bodies found.  Neck: Trachea normal, normal range of motion and phonation normal. Neck supple. Carotid bruit is not present. No mass and no thyromegaly present.  Cardiovascular: Normal rate, regular rhythm, S1 normal, S2 normal, normal heart sounds, intact distal pulses and normal pulses.  Exam reveals no gallop.   No murmur heard. Pulmonary/Chest: Effort normal and breath sounds normal. No respiratory distress. He has no wheezes. He has no rhonchi. He has no rales.  Abdominal: Soft. Normal appearance and bowel sounds are normal. There is no hepatosplenomegaly. There is no tenderness. There is no rebound, no guarding and no CVA tenderness. No hernia.  Neurological: He is alert. He has normal reflexes.  Skin: Skin is warm, dry and intact. No rash noted.  Psychiatric: He has a normal mood and affect. His speech is normal and behavior is normal. Judgment normal.          Assessment & Plan:

## 2012-02-10 NOTE — Assessment & Plan Note (Signed)
Improving, symptomatic care.  Discussed viral infection and course of treatment.

## 2012-03-05 ENCOUNTER — Encounter: Payer: 59 | Admitting: Internal Medicine

## 2012-04-14 ENCOUNTER — Encounter: Payer: Self-pay | Admitting: Internal Medicine

## 2012-04-14 ENCOUNTER — Ambulatory Visit (INDEPENDENT_AMBULATORY_CARE_PROVIDER_SITE_OTHER): Payer: 59 | Admitting: Internal Medicine

## 2012-04-14 VITALS — BP 120/68 | HR 64 | Temp 98.1°F | Ht 75.0 in | Wt 223.8 lb

## 2012-04-14 DIAGNOSIS — K219 Gastro-esophageal reflux disease without esophagitis: Secondary | ICD-10-CM

## 2012-04-14 DIAGNOSIS — E785 Hyperlipidemia, unspecified: Secondary | ICD-10-CM

## 2012-04-14 DIAGNOSIS — I10 Essential (primary) hypertension: Secondary | ICD-10-CM

## 2012-04-14 DIAGNOSIS — Z1211 Encounter for screening for malignant neoplasm of colon: Secondary | ICD-10-CM

## 2012-04-14 DIAGNOSIS — E119 Type 2 diabetes mellitus without complications: Secondary | ICD-10-CM

## 2012-04-14 DIAGNOSIS — Z0000 Encounter for general adult medical examination without abnormal findings: Secondary | ICD-10-CM | POA: Insufficient documentation

## 2012-04-14 MED ORDER — LISINOPRIL-HYDROCHLOROTHIAZIDE 20-25 MG PO TABS: 1.0000 | ORAL_TABLET | Freq: Every day | ORAL | Status: DC

## 2012-04-14 NOTE — Assessment & Plan Note (Signed)
Lab Results  Component Value Date   LDLCALC 101* 02/10/2012   Hadn't been taking the statin for a while He has now restarted

## 2012-04-14 NOTE — Assessment & Plan Note (Signed)
Doing well Had PSA Will do stool immunoassay

## 2012-04-14 NOTE — Assessment & Plan Note (Signed)
Lab Results  Component Value Date   HGBA1C 6.0 02/10/2012   Good control still without meds Discussed fitness

## 2012-04-14 NOTE — Assessment & Plan Note (Signed)
BP Readings from Last 3 Encounters:  04/14/12 120/68  02/10/12 120/88  11/05/11 118/80   Good control still Will decrease lisinopril and add to HCTZ

## 2012-04-14 NOTE — Progress Notes (Signed)
Subjective:    Patient ID: Hunter Richmond, male    DOB: [birth year], 51 y.o.   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 Discussed  B12 supplements  Checks sugars regularly Fasting 105 or so usually Discussed the statin---had been off briefly due to acquaintance who told him of the dangers Now back on regularly Has been trying to walk regularly every morning Some weight work with upper body also  Current Outpatient Prescriptions on File Prior to Visit  Medication Sig Dispense Refill  . amLODipine (NORVASC) 10 MG tablet Take 1 tablet (10 mg total) by mouth daily.  90 tablet  3  . glucose blood (ONE TOUCH ULTRA TEST) test strip 1 each daily as needed. Use as instructed       . hydrochlorothiazide (HYDRODIURIL) 25 MG tablet Take 1 tablet (25 mg total) by mouth daily.  90 tablet  3  . lisinopril (PRINIVIL,ZESTRIL) 40 MG tablet Take 1 tablet (40 mg total) by mouth daily.  90 tablet  3  . omeprazole (PRILOSEC) 20 MG capsule Take 20 mg by mouth daily.        . pravastatin (PRAVACHOL) 20 MG tablet Take 1 tablet (20 mg total) by mouth daily.  90 tablet  3    No Known Allergies  Past Medical History  Diagnosis Date  . Diabetes mellitus   . GERD (gastroesophageal reflux disease)   . Hypertension   . Hyperlipidemia     No past surgical history on file.  Family History  Problem Relation Age of Onset  . Diabetes Brother   . Cancer Maternal Aunt     breast cancer  . Cancer Maternal Uncle     throat  . Heart disease Neg Hx   . Diabetes Sister     History   Social History  . Marital Status: Married    Spouse Name: N/A    Number of Children: 1  . Years of Education: N/A   Occupational History  . 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  Smoking status: Never Smoker   . Smokeless tobacco: Never Used  . Alcohol Use: No  . Drug Use: No  . Sexually Active: Not on file   Other Topics Concern  . Not on file   Social History Narrative   Occupation: Charity fundraiser, owns 1 rental housePart time substitute teacherSeventh Day Adventist-- vegetarianExercises regularly--walks on treadmill   Review of Systems  Constitutional: Negative for fatigue and unexpected weight change.       Wears seat belt  HENT: Negative for hearing loss, congestion, rhinorrhea, dental problem and tinnitus.        Regular with dentist  Eyes: Negative for visual disturbance.       Due for eye exam--he will set up No vision changes  Respiratory: Negative for cough, chest tightness and shortness of breath.   Cardiovascular: Negative for chest pain, palpitations and leg swelling.  Gastrointestinal: Negative for nausea, vomiting, abdominal pain, constipation and blood in stool.       Heartburn has resolved---hasn't needed the med  Genitourinary: Negative for urgency, frequency and difficulty urinating.       No sexual problems  Musculoskeletal: Negative for back pain, joint swelling and arthralgias.  Skin: Negative  for rash.       No suspicious lesions  Neurological: Negative for dizziness, syncope, weakness, light-headedness and numbness.       Slight fleeting sense of instability at times  Hematological: Negative for adenopathy. Does not bruise/bleed easily.  Psychiatric/Behavioral: Negative for disturbed wake/sleep cycle and dysphoric mood. The patient is not nervous/anxious.        Objective:   Physical Exam  Constitutional: He is oriented to person, place, and time. He appears well-developed and well-nourished. No distress.  HENT:  Head: Normocephalic and atraumatic.  Right Ear: External ear normal.  Left Ear: External ear normal.  Mouth/Throat: Oropharynx is clear and moist. No oropharyngeal exudate.  Eyes: Conjunctivae and EOM are  normal. Pupils are equal, round, and reactive to light.  Neck: Normal range of motion. Neck supple. No thyromegaly present.  Cardiovascular: Normal rate, regular rhythm, normal heart sounds and intact distal pulses.  Exam reveals no gallop.   No murmur heard. Pulmonary/Chest: Effort normal and breath sounds normal. No respiratory distress. He has no wheezes. He has no rales.  Abdominal: Soft. There is no tenderness.  Musculoskeletal: Normal range of motion. He exhibits no edema and no tenderness.  Lymphadenopathy:    He has no cervical adenopathy.  Neurological: He is alert and oriented to person, place, and time.       Normal sensation on plantar feet  Skin: No rash noted. No erythema.  Psychiatric: He has a normal mood and affect. His behavior is normal. Thought content normal.          Assessment & Plan:

## 2012-04-14 NOTE — Assessment & Plan Note (Signed)
Better Not generally using the med

## 2012-04-22 ENCOUNTER — Other Ambulatory Visit: Payer: 59

## 2012-04-22 DIAGNOSIS — Z1211 Encounter for screening for malignant neoplasm of colon: Secondary | ICD-10-CM

## 2012-04-22 LAB — FECAL OCCULT BLOOD, IMMUNOCHEMICAL: Fecal Occult Bld: NEGATIVE

## 2012-04-26 ENCOUNTER — Encounter: Payer: Self-pay | Admitting: *Deleted

## 2012-05-12 ENCOUNTER — Other Ambulatory Visit: Payer: Self-pay

## 2012-05-12 MED ORDER — GLUCOSE BLOOD VI STRP: ORAL_STRIP | Status: DC

## 2012-05-12 MED ORDER — PRAVASTATIN SODIUM 20 MG PO TABS: 20.0000 mg | ORAL_TABLET | Freq: Every day | ORAL | Status: DC

## 2012-05-12 NOTE — Telephone Encounter (Signed)
Pt states 05/16/12 [facility] is mail order pharmacy. Pt request refill on Pravastatin and glucose test strips to [facility]. Pt notified while on phone refill done.

## 2012-10-15 ENCOUNTER — Ambulatory Visit (INDEPENDENT_AMBULATORY_CARE_PROVIDER_SITE_OTHER): Payer: 59 | Admitting: Internal Medicine

## 2012-10-15 ENCOUNTER — Encounter: Payer: Self-pay | Admitting: Internal Medicine

## 2012-10-15 VITALS — BP 128/80 | HR 70 | Temp 98.2°F | Wt 217.0 lb

## 2012-10-15 DIAGNOSIS — A084 Viral intestinal infection, unspecified: Secondary | ICD-10-CM

## 2012-10-15 DIAGNOSIS — A088 Other specified intestinal infections: Secondary | ICD-10-CM

## 2012-10-15 DIAGNOSIS — I10 Essential (primary) hypertension: Secondary | ICD-10-CM

## 2012-10-15 DIAGNOSIS — E785 Hyperlipidemia, unspecified: Secondary | ICD-10-CM

## 2012-10-15 DIAGNOSIS — E119 Type 2 diabetes mellitus without complications: Secondary | ICD-10-CM

## 2012-10-15 LAB — HEMOGLOBIN A1C: Hgb A1c MFr Bld: 6.5 % (ref 4.6–6.5)

## 2012-10-15 IMAGING — CR DG SHOULDER 2+V*L*
4 series · 4 of 4 positions shown · non-contrast
Comparison: None.

CLINICAL DATA: Intermittent shoulder pain in the AC joint area.

LEFT SHOULDER - 2+ VIEW

[view not recorded (1 of 4)]
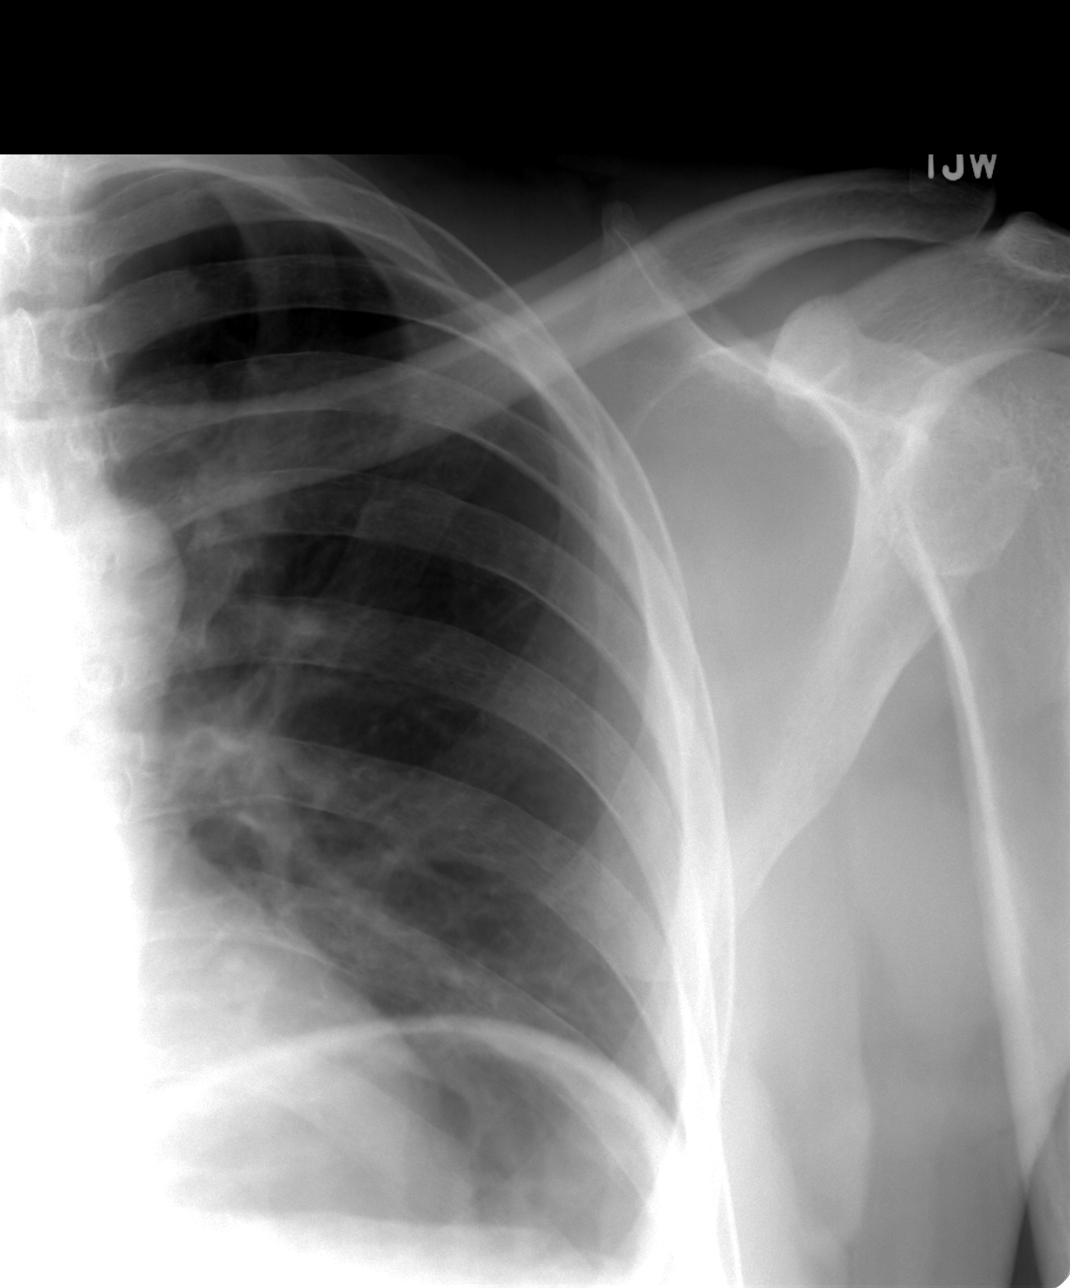

[view not recorded (2 of 4)]
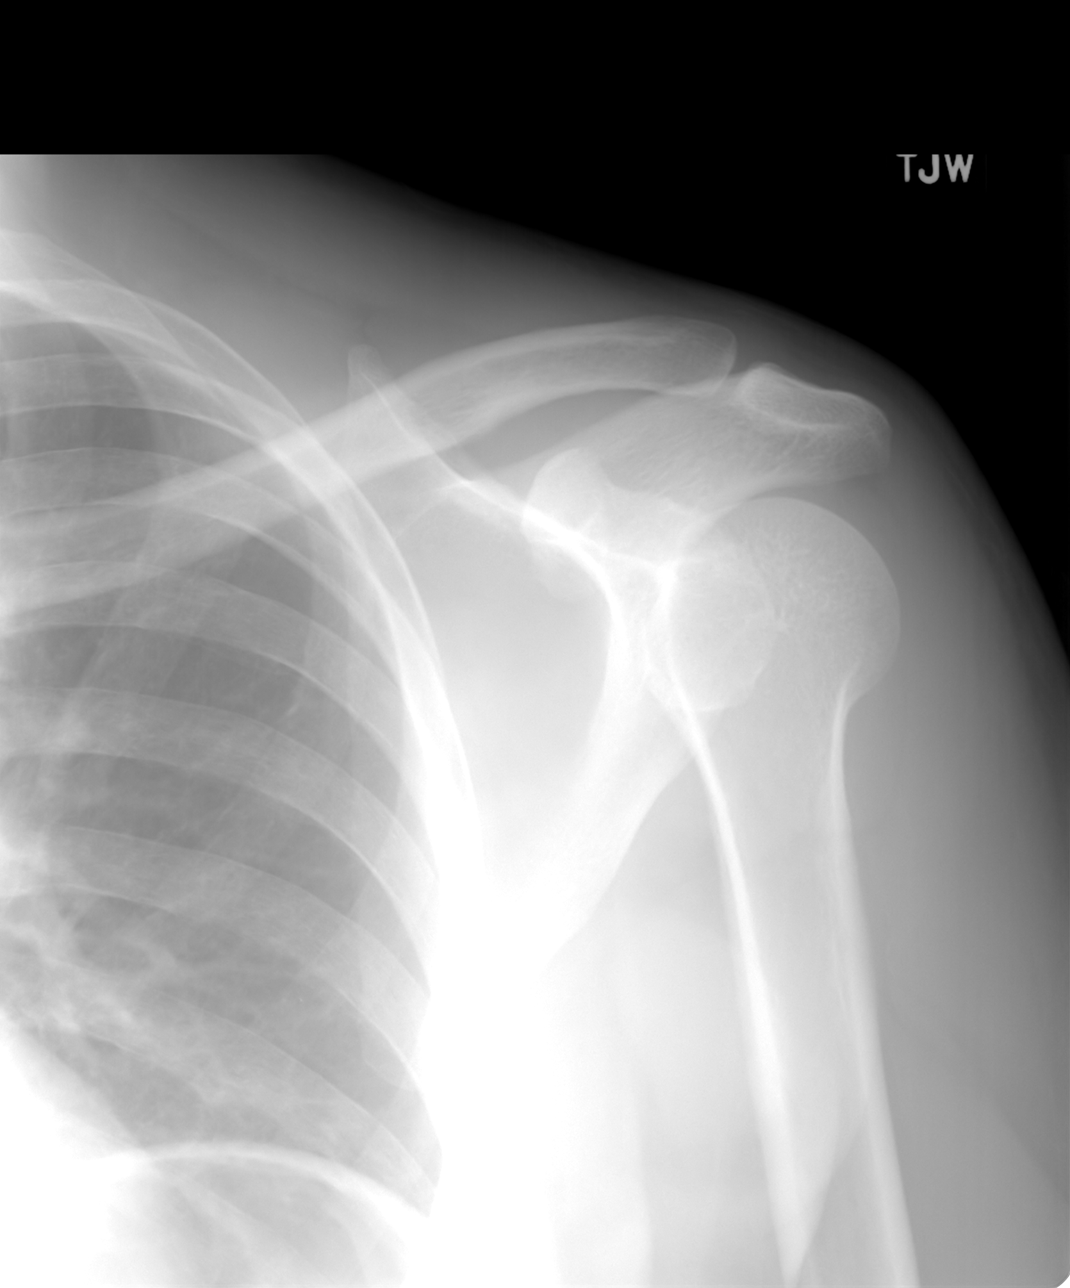

[view not recorded (3 of 4)]
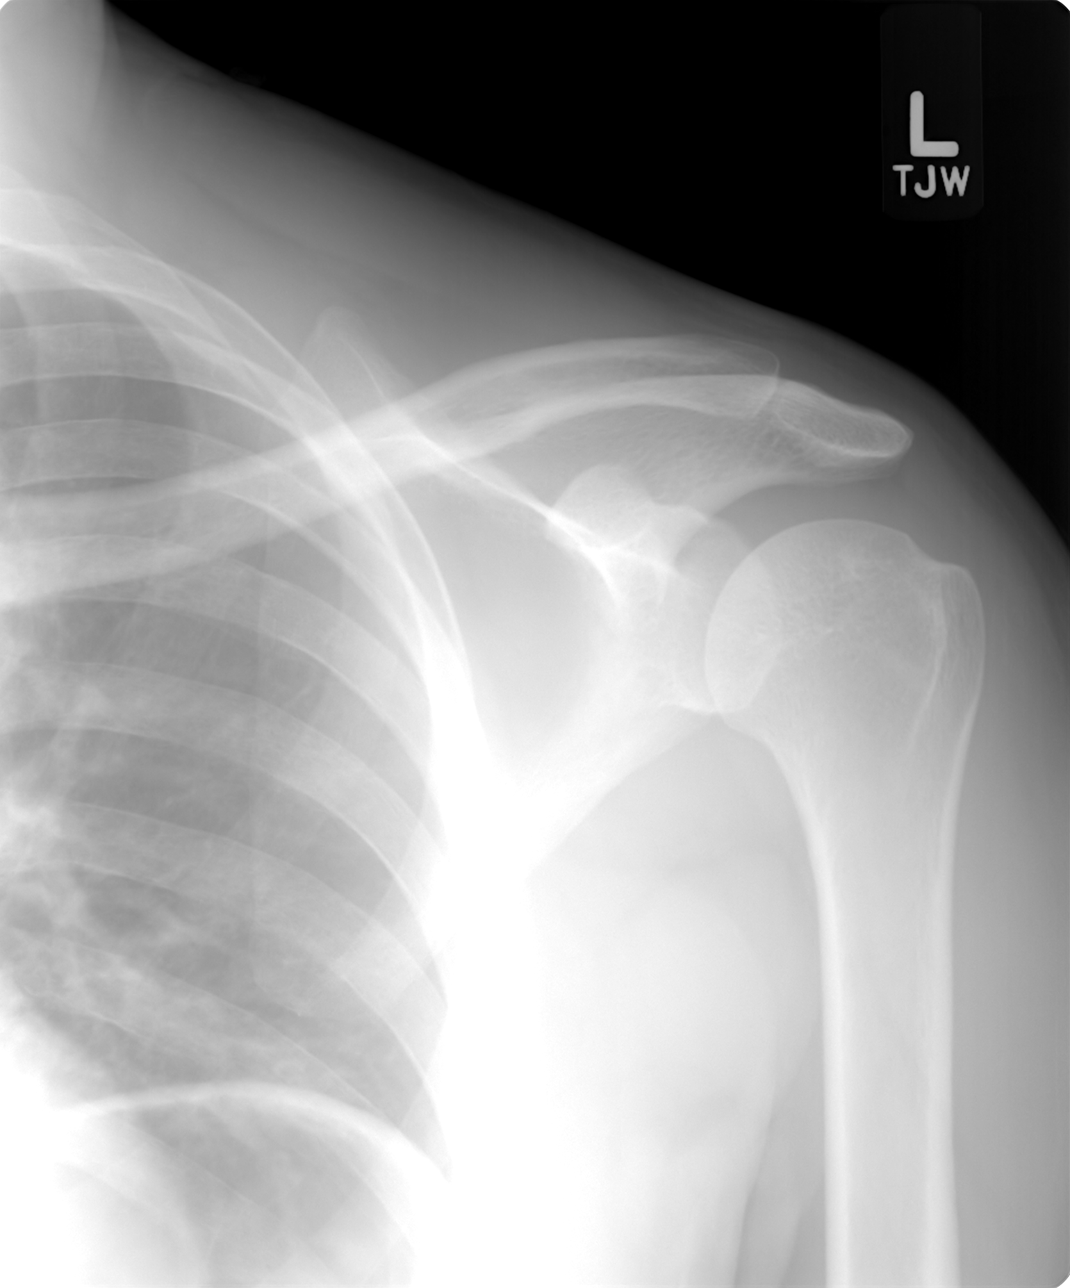

[view not recorded (4 of 4)]
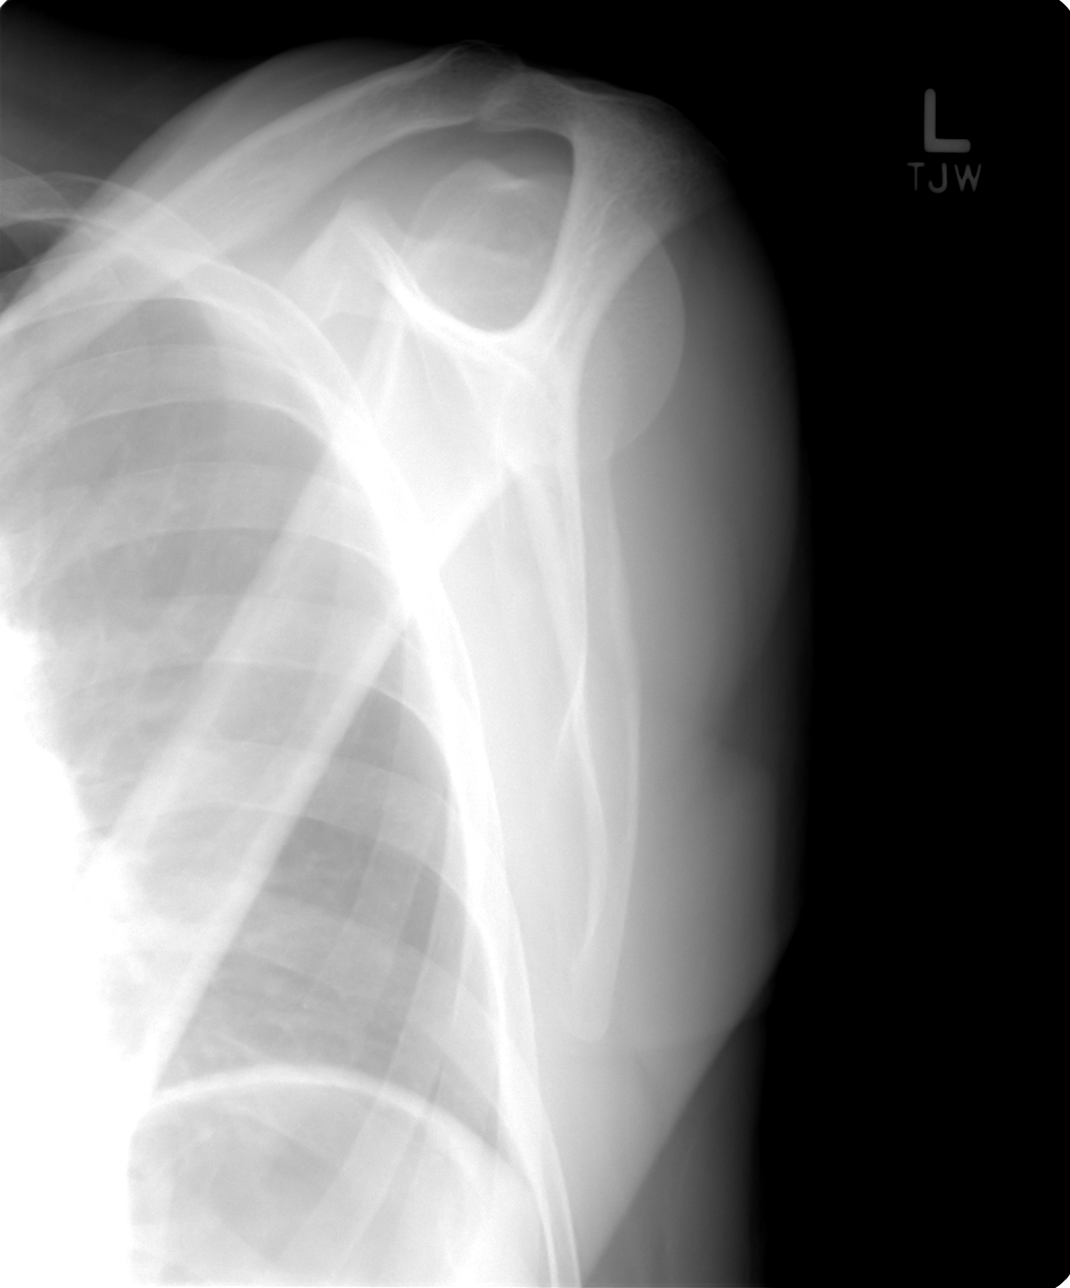

[4 of 4 positions shown; findings below may reference images not displayed]

FINDINGS: Alignment is normal.  Joint spaces are preserved.  No
fracture or dislocation is evident.  No soft tissue lesions are
seen.  No calcific bursitis or calcific tendonitis is evident.
IMPRESSION: No left shoulder abnormality is evident.

## 2012-10-15 NOTE — Progress Notes (Signed)
Subjective:    Patient ID: Hunter Richmond, male    DOB: 1961-06-06, 52 y.o.   MRN: [id number]  HPI Has some kind of a bug Stomach started rumbling 3 days ago Then a couple of loose stools Then started with bad abdominal pain while driving---vomited twice (acidic taste) Got real weak the first day Couldn't really eat but started feeling better by yesterday Had a big plate of food yesterday and then stomach really upset Still with lots of loose stools Bad water brash through this----restarted the prilosec (had been off it)  Checks sugars many mornings Did go up as high as 130 with the illness Usually under 110  No chest pain No SOB Has slacked off on exercise--now 2-3 times per week  Current Outpatient Prescriptions on File Prior to Visit  Medication Sig Dispense Refill  . amLODipine (NORVASC) 10 MG tablet Take 1 tablet (10 mg total) by mouth daily.  90 tablet  3  . glucose blood (ONE TOUCH ULTRA TEST) test strip Pt checks blood sugar once daily and as needed. 250.00  100 each  3  . lisinopril-hydrochlorothiazide (PRINZIDE,ZESTORETIC) 20-25 MG per tablet Take 1 tablet by mouth daily.  90 tablet  3  . omeprazole (PRILOSEC) 20 MG capsule Take 20 mg by mouth daily.        . pravastatin (PRAVACHOL) 20 MG tablet Take 1 tablet (20 mg total) by mouth daily.  90 tablet  3    No Known Allergies  Past Medical History  Diagnosis Date  . Diabetes mellitus   . GERD (gastroesophageal reflux disease)   . Hypertension   . Hyperlipidemia     No past surgical history on file.  Family History  Problem Relation Age of Onset  . Diabetes Brother   . Cancer Maternal Aunt     breast cancer  . Cancer Maternal Uncle     throat  . Heart disease Neg Hx   . Diabetes Sister     History   Social History  . Marital Status: Married    Spouse Name: N/A    Number of Children: 1  . 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 Smoking status: Never Smoker   . Smokeless tobacco: Never Used  . Alcohol Use: No  . Drug Use: No  . Sexually Active: Not on file   Other Topics Concern  . Not on file   Social History Narrative   Occupation: Charity fundraiser, owns 1 rental housePart time substitute teacherSeventh Day Adventist-- vegetarianExercises regularly--walks on treadmill   Review of Systems Has lost a few pounds---could be from the illness Sleeps okay    Objective:   Physical Exam  Constitutional: He appears well-developed and well-nourished. No distress.  Neck: Normal range of motion. Neck supple. No thyromegaly present.  Cardiovascular: Normal rate, regular rhythm, normal heart sounds and intact distal pulses.  Exam reveals no gallop.   No murmur heard. Pulmonary/Chest: Effort normal and breath sounds normal. No respiratory distress. He has no wheezes. He has no rales.  Abdominal: Soft. He exhibits no distension and no mass. There is no tenderness. There is no rebound and no guarding.  Musculoskeletal: He exhibits no edema and no tenderness.  Lymphadenopathy:    He has no cervical adenopathy.  Skin: No rash noted.       No foot lesions  Psychiatric: He has a  normal mood and affect. His behavior is normal.          Assessment & Plan:

## 2012-10-15 NOTE — Assessment & Plan Note (Signed)
BP Readings from Last 3 Encounters:  10/15/12 128/80  04/14/12 120/68  02/10/12 120/88   Good control No changes needed

## 2012-10-15 NOTE — Assessment & Plan Note (Signed)
Lab Results  Component Value Date   LDLCALC 101* 02/10/2012   Close to goal Recheck next time

## 2012-10-15 NOTE — Assessment & Plan Note (Signed)
Seems likely to be norovirus  Resolving Discussed supportive care

## 2012-10-15 NOTE — Assessment & Plan Note (Signed)
Still seems to have excellent control Check A1c

## 2012-10-18 ENCOUNTER — Encounter: Payer: Self-pay | Admitting: *Deleted

## 2012-12-07 ENCOUNTER — Other Ambulatory Visit: Payer: Self-pay | Admitting: Internal Medicine

## 2012-12-10 ENCOUNTER — Encounter: Payer: Self-pay | Admitting: Internal Medicine

## 2012-12-10 ENCOUNTER — Ambulatory Visit (INDEPENDENT_AMBULATORY_CARE_PROVIDER_SITE_OTHER): Payer: 59 | Admitting: Internal Medicine

## 2012-12-10 VITALS — BP 120/80 | HR 79 | Temp 98.2°F | Wt 227.0 lb

## 2012-12-10 DIAGNOSIS — R002 Palpitations: Secondary | ICD-10-CM

## 2012-12-10 DIAGNOSIS — M545 Low back pain: Secondary | ICD-10-CM

## 2012-12-10 LAB — POCT URINALYSIS DIPSTICK
Ketones, UA: NEGATIVE
Protein, UA: NEGATIVE
Spec Grav, UA: <=1.005
pH, UA: 7

## 2012-12-10 MED ORDER — AMLODIPINE BESYLATE 10 MG PO TABS: 10.0000 mg | ORAL_TABLET | Freq: Every day | ORAL | Status: DC

## 2012-12-10 NOTE — Assessment & Plan Note (Signed)
No worrisome features EKG normal except 1 PAC Doubt anything of concern reassured

## 2012-12-10 NOTE — Assessment & Plan Note (Signed)
Sounds muscular Not sure about the urine thing---urinalysis normal Seems better now

## 2012-12-10 NOTE — Progress Notes (Signed)
Subjective:    Patient ID: Hunter Richmond, male    DOB: 10/07/1960, 52 y.o.   MRN: [id number]  HPI Having a back problem Started about a week ago Lower left back---laterally Thought it was muscular but then went to void and the pain went away Now the pain has gone up a little --like to CVA area-- and then back down again Intermittent  Did shovel snow a while back--- picks up things and may feel a "tweak" Exercises but no core work No hematuria No dysuria No heat or meds for this  Got woozy feeling a couple of days ago--not pre syncopal May last 15 minutes BP may be slightly higher than his usual then Notices some skips in heart when this happens No exacerbating features No chest pain No SOB then  Current Outpatient Prescriptions on File Prior to Visit  Medication Sig Dispense Refill  . glucose blood (ONE TOUCH ULTRA TEST) test strip Pt checks blood sugar once daily and as needed. 250.00  100 each  3  . lisinopril-hydrochlorothiazide (PRINZIDE,ZESTORETIC) 20-25 MG per tablet Take 1 tablet by mouth daily.  90 tablet  3  . omeprazole (PRILOSEC) 20 MG capsule Take 20 mg by mouth daily.        . pravastatin (PRAVACHOL) 20 MG tablet Take 1 tablet (20 mg total) by mouth daily.  90 tablet  3   No current facility-administered medications on file prior to visit.    No Known Allergies  Past Medical History  Diagnosis Date  . Diabetes mellitus   . GERD (gastroesophageal reflux disease)   . Hypertension   . Hyperlipidemia     No past surgical history on file.  Family History  Problem Relation Age of Onset  . Diabetes Brother   . Cancer Maternal Aunt     breast cancer  . Cancer Maternal Uncle     throat  . Heart disease Neg Hx   . Diabetes Sister     History   Social History  . Marital Status: Married    Spouse Name: N/A    Number of Children: 1  . 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 Smoking status: Never Smoker   . Smokeless tobacco: Never Used  . Alcohol Use: No  . Drug Use: No  . Sexually Active: Not on file   Other Topics Concern  . 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 No distress.  Neck: Normal range of motion. Neck supple. No thyromegaly present.  Cardiovascular: Normal rate, regular rhythm, normal heart sounds and intact distal pulses.  Exam reveals no gallop.   No murmur heard. Pulmonary/Chest: Effort normal and breath sounds normal. No respiratory distress. He has no wheezes. He has no rales.  Abdominal: Soft. There is no tenderness.  Musculoskeletal:  No spine or any back tenderness Normal flexion SLR negative bilaterally Normal ROM both hips  Lymphadenopathy:    He has no cervical adenopathy.  Neurological:  Gait normal No focal weakness  Psychiatric: He has a normal mood and affect. His behavior is normal.          Assessment & Plan:

## 2013-01-16 ENCOUNTER — Ambulatory Visit: Payer: Self-pay | Admitting: Emergency Medicine

## 2013-01-16 VITALS — BP 135/89 | HR 80 | Temp 98.0°F | Resp 16 | Ht 75.5 in | Wt 227.0 lb

## 2013-01-16 DIAGNOSIS — Z0000 Encounter for general adult medical examination without abnormal findings: Secondary | ICD-10-CM

## 2013-01-16 DIAGNOSIS — Z0289 Encounter for other administrative examinations: Secondary | ICD-10-CM

## 2013-01-16 NOTE — Progress Notes (Signed)
  Subjective:    Patient ID: Hunter Richmond, male    DOB: Nov 13, 1960, 52 y.o.   MRN: [id number]  HPI patient here for self-pay DOT. He has diabetes hyperlipidemia and hypertension. He is followed closely at [facility].    Review of Systems     Objective:   Physical Exam HEENT exam is unremarkable. His neck is supple. Chest is clear to auscultation and percussion. Heart regular rate no murmurs or gallops appreciated. Abdomen is soft liver and spleen not enlarged there are no areas of tenderness no masses are felt GU reveals a normal male testicles descended no hernias felt. Extremity exam is normal        Assessment & Plan:  Last hemoglobin A1c was 6.4. His blood pressure meets the standard. He is given a one-year card.

## 2013-03-31 ENCOUNTER — Encounter: Payer: Self-pay | Admitting: Emergency Medicine

## 2013-04-15 ENCOUNTER — Ambulatory Visit (INDEPENDENT_AMBULATORY_CARE_PROVIDER_SITE_OTHER): Payer: 59 | Admitting: Internal Medicine

## 2013-04-15 ENCOUNTER — Encounter: Payer: Self-pay | Admitting: Internal Medicine

## 2013-04-15 VITALS — BP 130/80 | HR 61 | Temp 98.4°F | Ht 75.0 in | Wt 229.0 lb

## 2013-04-15 DIAGNOSIS — E1149 Type 2 diabetes mellitus with other diabetic neurological complication: Secondary | ICD-10-CM

## 2013-04-15 DIAGNOSIS — Z23 Encounter for immunization: Secondary | ICD-10-CM

## 2013-04-15 DIAGNOSIS — E785 Hyperlipidemia, unspecified: Secondary | ICD-10-CM

## 2013-04-15 DIAGNOSIS — Z0000 Encounter for general adult medical examination without abnormal findings: Secondary | ICD-10-CM

## 2013-04-15 DIAGNOSIS — Z1211 Encounter for screening for malignant neoplasm of colon: Secondary | ICD-10-CM

## 2013-04-15 DIAGNOSIS — K219 Gastro-esophageal reflux disease without esophagitis: Secondary | ICD-10-CM

## 2013-04-15 DIAGNOSIS — I10 Essential (primary) hypertension: Secondary | ICD-10-CM

## 2013-04-15 LAB — CBC WITH DIFFERENTIAL/PLATELET
Basophils Relative: 0.4 % (ref 0.0–3.0)
Eosinophils Relative: 1.5 % (ref 0.0–5.0)
Lymphocytes Relative: 37.3 % (ref 12.0–46.0)
MCV: 93 fl (ref 78.0–100.0)
Monocytes Absolute: 0.4 10*3/uL (ref 0.1–1.0)
Monocytes Relative: 6.7 % (ref 3.0–12.0)
Neutrophils Relative %: 54.1 % (ref 43.0–77.0)
RBC: 4.91 Mil/uL (ref 4.22–5.81)
WBC: 5.6 10*3/uL (ref 4.5–10.5)

## 2013-04-15 LAB — LIPID PANEL
HDL: 33.6 mg/dL — ABNORMAL LOW (ref >39.00)
Total CHOL/HDL Ratio: 4
VLDL: 21.4 mg/dL (ref 0.0–40.0)

## 2013-04-15 LAB — HEPATIC FUNCTION PANEL
ALT: 16 U/L (ref 0–53)
AST: 17 U/L (ref 0–37)
Alkaline Phosphatase: 66 U/L (ref 39–117)
Bilirubin, Direct: 0 mg/dL (ref 0.0–0.3)
Total Bilirubin: 0.8 mg/dL (ref 0.3–1.2)

## 2013-04-15 LAB — BASIC METABOLIC PANEL
Calcium: 9.2 mg/dL (ref 8.4–10.5)
Creatinine, Ser: 0.8 mg/dL (ref 0.4–1.5)
GFR: 132.75 mL/min (ref >60.00)
Glucose, Bld: 137 mg/dL — ABNORMAL HIGH (ref 70–99)
Sodium: 138 mEq/L (ref 135–145)

## 2013-04-15 MED ORDER — LISINOPRIL-HYDROCHLOROTHIAZIDE 20-25 MG PO TABS: 1.0000 | ORAL_TABLET | Freq: Every day | ORAL | Status: DC

## 2013-04-15 MED ORDER — PRAVASTATIN SODIUM 20 MG PO TABS: 20.0000 mg | ORAL_TABLET | Freq: Every day | ORAL | Status: DC

## 2013-04-15 NOTE — Assessment & Plan Note (Signed)
Generally doing well Needs to increase exercise again Will check fecal immunoassay Defer PSA till next year

## 2013-04-15 NOTE — Assessment & Plan Note (Signed)
BP Readings from Last 3 Encounters:  04/15/13 130/80  01/16/13 135/89  12/10/12 120/80   Still good control

## 2013-04-15 NOTE — Assessment & Plan Note (Signed)
No problems with the statin 

## 2013-04-15 NOTE — Patient Instructions (Addendum)
Please set up your eye exam. You have a chalazion on your eye.

## 2013-04-15 NOTE — Progress Notes (Signed)
Subjective:    Patient ID: Hunter Richmond, male    DOB: 03/14/1961, 52 y.o.   MRN: [id number]  HPI Here for physical  Feels he is doing okay in general Not exercising that consistently Weight up slightly  Checks sugars every other day or so Typically under 110 No hypoglycemic reactions in past year----has had some "shakes" if he misses a meal Overdue for eye exam Rare numbness in feet--no pain  Current Outpatient Prescriptions on File Prior to Visit  Medication Sig Dispense Refill  . amLODipine (NORVASC) 10 MG tablet Take 1 tablet (10 mg total) by mouth daily.  30 tablet  0  . glucose blood (ONE TOUCH ULTRA TEST) test strip Pt checks blood sugar once daily and as needed. 250.00  100 each  3  . vitamin B-12 (CYANOCOBALAMIN) 1000 MCG tablet Take 1,000 mcg by mouth daily.       No current facility-administered medications on file prior to visit.    No Known Allergies  Past Medical History  Diagnosis Date  . Diabetes mellitus   . GERD (gastroesophageal reflux disease)   . Hypertension   . Hyperlipidemia     No past surgical history on file.  Family History  Problem Relation Age of Onset  . Diabetes Brother   . Cancer Maternal Aunt     breast cancer  . Cancer Maternal Uncle     throat  . Heart disease Neg Hx   . Diabetes Sister     History   Social History  . Marital Status: Married    Spouse Name: N/A    Number of Children: 1  . 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     [employer]      Social History Main Topics  . Smoking status: Never Smoker   . Smokeless tobacco: Never Used  . Alcohol Use: No  . Drug Use: No  . Sexually Active: Not on file   Other Topics Concern  . Not on file   Social History Narrative   [employer]-- vegetarian                     Review of Systems  Constitutional: Negative for fatigue and unexpected weight change.       Wears seat belt  HENT:  Negative for hearing loss, dental problem and tinnitus.        Regular with dentist  Eyes: Negative for visual disturbance.       No vision problems Has lump in left upper lateral eyelid for about a month  Respiratory: Negative for cough, chest tightness and shortness of breath.   Cardiovascular: Positive for leg swelling. Negative for chest pain and palpitations.       Mild edema after long day driving--discussed compression socks  Gastrointestinal: Negative for nausea, vomiting, abdominal pain, constipation and blood in stool.       Some gas and heartburn--- uses the omeprazole for a couple of weeks and things quiet down for months.  Endocrine: Negative for cold intolerance and heat intolerance.  Genitourinary: Negative for urgency, frequency and difficulty urinating.       No sexual problems  Musculoskeletal: Negative for back pain, joint swelling and arthralgias.  Skin: Positive for rash.       No suspicious lesions  Left inner thigh rash-- goes back 4 months. No itching  Allergic/Immunologic: Negative for environmental allergies and  immunocompromised state.  Neurological: Positive for numbness. Negative for dizziness, syncope, weakness, light-headedness and headaches.  Hematological: Negative for adenopathy. Does not bruise/bleed easily.  Psychiatric/Behavioral: Negative for sleep disturbance and dysphoric mood. The patient is not nervous/anxious.        Objective:   Physical Exam  Constitutional: He is oriented to person, place, and time. He appears well-developed and well-nourished. No distress.  HENT:  Head: Normocephalic and atraumatic.  Right Ear: External ear normal.  Left Ear: External ear normal.  Mouth/Throat: Oropharynx is clear and moist. No oropharyngeal exudate.  Eyes: Conjunctivae and EOM are normal. Pupils are equal, round, and reactive to light.  Chalazion in upper left lid  Neck: Normal range of motion. Neck supple. No thyromegaly present.  Cardiovascular:  Normal rate, regular rhythm, normal heart sounds and intact distal pulses.  Exam reveals no gallop.   No murmur heard. Pulmonary/Chest: Effort normal and breath sounds normal. No respiratory distress. He has no wheezes. He has no rales.  Abdominal: Soft. There is no tenderness.  Musculoskeletal: He exhibits no edema and no tenderness.  Lymphadenopathy:    He has no cervical adenopathy.  Neurological: He is alert and oriented to person, place, and time.  Skin: No rash noted. No erythema.  Small area of hyperpigmentation on inner left upper thigh  Psychiatric: He has a normal mood and affect. His behavior is normal.          Assessment & Plan:

## 2013-04-15 NOTE — Assessment & Plan Note (Signed)
occ symptoms that omeprazole controls

## 2013-04-15 NOTE — Assessment & Plan Note (Signed)
Seems to still have good control Very early neuropathy Due for labs

## 2013-04-15 NOTE — Addendum Note (Signed)
Addended by: Sueanne Margarita on: 04/15/2013 09:23 AM   Modules accepted: Orders

## 2013-04-18 ENCOUNTER — Encounter: Payer: Self-pay | Admitting: *Deleted

## 2013-04-28 ENCOUNTER — Other Ambulatory Visit (INDEPENDENT_AMBULATORY_CARE_PROVIDER_SITE_OTHER): Payer: 59

## 2013-04-28 DIAGNOSIS — Z1211 Encounter for screening for malignant neoplasm of colon: Secondary | ICD-10-CM

## 2013-06-17 ENCOUNTER — Other Ambulatory Visit: Payer: Self-pay | Admitting: Internal Medicine

## 2013-07-11 ENCOUNTER — Other Ambulatory Visit: Payer: Self-pay | Admitting: Internal Medicine

## 2013-07-21 ENCOUNTER — Other Ambulatory Visit: Payer: Self-pay

## 2013-09-09 LAB — HM DIABETES EYE EXAM

## 2013-09-20 ENCOUNTER — Encounter: Payer: Self-pay | Admitting: Internal Medicine

## 2013-10-21 ENCOUNTER — Ambulatory Visit: Payer: 59 | Admitting: Internal Medicine

## 2013-10-28 ENCOUNTER — Encounter: Payer: Self-pay | Admitting: Internal Medicine

## 2013-10-28 ENCOUNTER — Ambulatory Visit (INDEPENDENT_AMBULATORY_CARE_PROVIDER_SITE_OTHER): Payer: 59 | Admitting: Internal Medicine

## 2013-10-28 VITALS — BP 118/80 | HR 77 | Temp 97.8°F | Wt 218.0 lb

## 2013-10-28 DIAGNOSIS — E1149 Type 2 diabetes mellitus with other diabetic neurological complication: Secondary | ICD-10-CM

## 2013-10-28 DIAGNOSIS — E785 Hyperlipidemia, unspecified: Secondary | ICD-10-CM

## 2013-10-28 DIAGNOSIS — I10 Essential (primary) hypertension: Secondary | ICD-10-CM

## 2013-10-28 LAB — HEMOGLOBIN A1C: HEMOGLOBIN A1C: 7 % — AB (ref 4.6–6.5)

## 2013-10-28 LAB — HM DIABETES FOOT EXAM

## 2013-10-28 MED ORDER — AMLODIPINE BESYLATE 10 MG PO TABS: 10.0000 mg | ORAL_TABLET | Freq: Every day | ORAL | Status: DC

## 2013-10-28 NOTE — Assessment & Plan Note (Signed)
Lab Results  Component Value Date   LDLCALC 77 04/15/2013   No problems with statin

## 2013-10-28 NOTE — Progress Notes (Signed)
Pre-visit discussion using our clinic review tool. No additional management support is needed unless otherwise documented below in the visit note.  

## 2013-10-28 NOTE — Assessment & Plan Note (Signed)
BP Readings from Last 3 Encounters:  10/28/13 118/80  04/15/13 130/80  01/16/13 135/89   Good control

## 2013-10-28 NOTE — Assessment & Plan Note (Signed)
Good control Very mild numbness in past--mostly resolved now

## 2013-10-28 NOTE — Progress Notes (Signed)
   Subjective:    Patient ID: Hunter Richmond, male    DOB: [birth year], 53 y.o.   MRN: [id number]  HPI Feels good Has lost 11# Exercising daily Tries to eat well--mostly vegetarian  Checks sugars daily--occasionally 3-4 times per day Tries to monitor before and after exercise Fasting sugars 100-110 generally No hypoglycemic reactions  Got eye exam Referred to Dr Sharyne Peach-- needed some chalazions removed  No chest pain  No SOB No dizziness or syncope No edema  No muscle soreness No GI problems with statin  Current Outpatient Prescriptions on File Prior to Visit  Medication Sig Dispense Refill  . glucose blood (ONE TOUCH ULTRA TEST) test strip Test blood sugar once daily dx: 250.60  100 each  3  . lisinopril-hydrochlorothiazide (PRINZIDE,ZESTORETIC) 20-25 MG per tablet Take 1 tablet by mouth daily.  90 tablet  3  . omeprazole (PRILOSEC) 20 MG capsule Take 20 mg by mouth daily.      . pravastatin (PRAVACHOL) 20 MG tablet Take 1 tablet by mouth  daily  90 tablet  3  . vitamin B-12 (CYANOCOBALAMIN) 1000 MCG tablet Take 1,000 mcg by mouth daily.       No current facility-administered medications on file prior to visit.    No Known Allergies  Past Medical History  Diagnosis Date  . Diabetes mellitus   . GERD (gastroesophageal reflux disease)   . Hypertension   . Hyperlipidemia     No past surgical history on file.  Family History  Problem Relation Age of Onset  . Diabetes Brother   . Cancer Maternal Aunt     breast cancer  . Cancer Maternal Uncle     throat  . Heart disease Neg Hx   . Diabetes Sister     History   Social History  . Marital Status: Married    Spouse Name: N/A    Number of Children: 1  . 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     [employer]      Social History Main Topics  . Smoking status: Never Smoker   . Smokeless tobacco: Never Used  . Alcohol Use: No  . Drug Use: No  .  Sexual Activity: Not on file   Other Topics Concern  . Not on file   Social History Narrative   [employer]-- vegetarian                     Review of Systems No recent sharp left temple pains---had some brief spells that are gone Sleeps well     Objective:   Physical Exam  Constitutional: He appears well-developed and well-nourished. No distress.  Neck: Normal range of motion. Neck supple. No thyromegaly present.  Cardiovascular: Normal rate, regular rhythm, normal heart sounds and intact distal pulses.  Exam reveals no gallop.   No murmur heard. Pulmonary/Chest: Effort normal and breath sounds normal. No respiratory distress. He has no wheezes. He has no rales.  Musculoskeletal: He exhibits no edema.  Lymphadenopathy:    He has no cervical adenopathy.  Neurological:  Normal foot sensation  Skin:  No foot lesions  Psychiatric: He has a normal mood and affect. His behavior is normal.          Assessment & Plan:

## 2013-10-31 ENCOUNTER — Telehealth: Payer: Self-pay | Admitting: Internal Medicine

## 2013-10-31 NOTE — Telephone Encounter (Signed)
Relevant patient education assigned to patient using Emmi. ° °

## 2013-11-03 ENCOUNTER — Telehealth: Payer: Self-pay

## 2013-11-03 NOTE — Telephone Encounter (Signed)
Relevant patient education assigned to patient using Emmi. ° °

## 2013-12-22 ENCOUNTER — Other Ambulatory Visit: Payer: Self-pay

## 2014-03-20 ENCOUNTER — Other Ambulatory Visit: Payer: Self-pay | Admitting: Internal Medicine

## 2014-04-08 ENCOUNTER — Encounter (HOSPITAL_COMMUNITY): Payer: Self-pay | Admitting: Emergency Medicine

## 2014-04-08 ENCOUNTER — Emergency Department (HOSPITAL_COMMUNITY)
Admission: EM | Admit: 2014-04-08 | Discharge: 2014-04-08 | Disposition: A | Discharge status: Home / Self Care | Payer: 59 | Source: Home / Self Care | Attending: Family Medicine | Admitting: Family Medicine

## 2014-04-08 DIAGNOSIS — H6120 Impacted cerumen, unspecified ear: Secondary | ICD-10-CM

## 2014-04-08 DIAGNOSIS — H6121 Impacted cerumen, right ear: Secondary | ICD-10-CM

## 2014-04-08 DIAGNOSIS — H918X9 Other specified hearing loss, unspecified ear: Secondary | ICD-10-CM

## 2014-04-08 NOTE — Discharge Instructions (Signed)
Return as needed, no Q-tips

## 2014-04-08 NOTE — ED Provider Notes (Signed)
CSN: [id number]     Arrival date & time 04/08/14  0901 History   First MD Initiated Contact with Patient 04/08/14 0912     Chief Complaint  Patient presents with  . Otalgia   (Consider location/radiation/quality/duration/timing/severity/associated sxs/prior Treatment) Patient is a 53 y.o. male presenting with ear pain. The history is provided by the patient.  Otalgia Location:  Right Behind ear:  No abnormality Quality:  Dull Severity:  Mild Onset quality:  Gradual Duration:  3 days Progression:  Worsening Chronicity:  New Associated symptoms: hearing loss   Associated symptoms: no ear discharge and no rhinorrhea   Associated symptoms comment:  Onset after using q-tips   Past Medical History  Diagnosis Date  . Diabetes mellitus   . GERD (gastroesophageal reflux disease)   . Hypertension   . Hyperlipidemia    History reviewed. No pertinent past surgical history. Family History  Problem Relation Age of Onset  . Diabetes Brother   . Cancer Maternal Aunt     breast cancer  . Cancer Maternal Uncle     throat  . Heart disease Neg Hx   . Diabetes Sister    History  Substance Use Topics  . Smoking status: Never Smoker   . Smokeless tobacco: Never Used  . Alcohol Use: No    Review of Systems  Constitutional: Negative.   HENT: Positive for ear pain and hearing loss. Negative for ear discharge, postnasal drip and rhinorrhea.     Allergies  Review of patient's allergies indicates no known allergies.  Home Medications   Prior to Admission medications   Medication Sig Start Date End Date Taking? Authorizing Provider  amLODipine (NORVASC) 10 MG tablet Take 1 tablet (10 mg total) by mouth daily. 10/28/13   Venia Carbon, MD  glucose blood (ONE TOUCH ULTRA TEST) test strip Test blood sugar once daily dx: 250.60 07/11/13   Venia Carbon, MD  lisinopril-hydrochlorothiazide (PRINZIDE,ZESTORETIC) 20-25 MG per tablet Take 1 tablet by mouth daily. 04/15/13   Venia Carbon, MD  omeprazole (PRILOSEC) 20 MG capsule Take 20 mg by mouth daily.    Historical Provider, MD  pravastatin (PRAVACHOL) 20 MG tablet Take 1 tablet by mouth  daily 03/20/14   Venia Carbon, MD  vitamin B-12 (CYANOCOBALAMIN) 1000 MCG tablet Take 1,000 mcg by mouth daily.    Historical Provider, MD   BP 147/90  Pulse 73  Temp(Src) 97.9 F (36.6 C) (Oral)  Resp 16  SpO2 100% Physical Exam  Nursing note and vitals reviewed. Constitutional: He appears well-developed and well-nourished.  HENT:  Right Ear: No drainage, swelling or tenderness.  Left Ear: External ear normal.  Ears:  Mouth/Throat: Oropharynx is clear and moist.  Eyes: Pupils are equal, round, and reactive to light.  Neck: Normal range of motion. Neck supple.  Lymphadenopathy:    He has no cervical adenopathy.  Skin: Skin is warm and dry.    ED Course  Procedures (including critical care time) Labs Review Labs Reviewed - No data to display  Imaging Review No results found.   MDM   1. Hearing loss due to cerumen impaction, right    Sx resolved canal and tm nl after irrig.    Billy Fischer, MD 04/08/14 [phone]

## 2014-04-08 NOTE — ED Notes (Signed)
Pt  Reports   r   Earache       With  Pain     And  Decreased  Hearing       With  Symptoms  X  sev  Days        Pt   Ambulatory  To  Exam room      Speaking in  Complete  sentances

## 2014-04-19 ENCOUNTER — Other Ambulatory Visit: Payer: Self-pay | Admitting: Internal Medicine

## 2014-04-24 ENCOUNTER — Other Ambulatory Visit: Payer: Self-pay | Admitting: Internal Medicine

## 2014-04-28 ENCOUNTER — Ambulatory Visit: Payer: 59 | Admitting: Internal Medicine

## 2014-05-08 ENCOUNTER — Ambulatory Visit (INDEPENDENT_AMBULATORY_CARE_PROVIDER_SITE_OTHER): Payer: 59 | Admitting: Internal Medicine

## 2014-05-08 ENCOUNTER — Encounter: Payer: Self-pay | Admitting: Internal Medicine

## 2014-05-08 VITALS — BP 120/80 | HR 70 | Temp 97.5°F | Wt 221.0 lb

## 2014-05-08 DIAGNOSIS — E1142 Type 2 diabetes mellitus with diabetic polyneuropathy: Secondary | ICD-10-CM | POA: Insufficient documentation

## 2014-05-08 DIAGNOSIS — K219 Gastro-esophageal reflux disease without esophagitis: Secondary | ICD-10-CM

## 2014-05-08 DIAGNOSIS — E1149 Type 2 diabetes mellitus with other diabetic neurological complication: Secondary | ICD-10-CM

## 2014-05-08 DIAGNOSIS — Z1211 Encounter for screening for malignant neoplasm of colon: Secondary | ICD-10-CM

## 2014-05-08 DIAGNOSIS — Z23 Encounter for immunization: Secondary | ICD-10-CM

## 2014-05-08 DIAGNOSIS — I10 Essential (primary) hypertension: Secondary | ICD-10-CM

## 2014-05-08 DIAGNOSIS — Z125 Encounter for screening for malignant neoplasm of prostate: Secondary | ICD-10-CM

## 2014-05-08 DIAGNOSIS — E785 Hyperlipidemia, unspecified: Secondary | ICD-10-CM

## 2014-05-08 MED ORDER — LISINOPRIL-HYDROCHLOROTHIAZIDE 20-25 MG PO TABS: 1.0000 | ORAL_TABLET | Freq: Every day | ORAL | Status: DC

## 2014-05-08 NOTE — Addendum Note (Signed)
Addended by: Despina Hidden on: 05/08/2014 04:58 PM   Modules accepted: Orders

## 2014-05-08 NOTE — Assessment & Plan Note (Signed)
Transient numbness only No Rx needed

## 2014-05-08 NOTE — Assessment & Plan Note (Signed)
BP Readings from Last 3 Encounters:  05/08/14 120/80  04/08/14 147/90  10/28/13 118/80   Good control No changes needed

## 2014-05-08 NOTE — Assessment & Plan Note (Signed)
Still seems to have good control Due for labs 

## 2014-05-08 NOTE — Progress Notes (Signed)
Pre visit review using our clinic review tool, if applicable. No additional management support is needed unless otherwise documented below in the visit note. 

## 2014-05-08 NOTE — Progress Notes (Signed)
Subjective:    Patient ID: Hunter Richmond, male    DOB: 07-28-1961, 53 y.o.   MRN: [id number]  HPI Doing fairly well Disappointed about his 3# weight gain Still careful with eating Has dropped off slightly on his exercise  Checks sugars most days AM usually around 110 No hypoglycemic reactions Rare numbness in left toes-- nothing persistent. No sores  No chest pain--has had some gas attacks at times (but can feel the rumbling) No SOB No dizziness or syncope---but has had lightheadedness for a second or so (usually when sitting) No edema  Still on statin No myalgias or GI problems on statin Has not needed the omeprazole lately-- concerned about some of the problems reported with it  Current Outpatient Prescriptions on File Prior to Visit  Medication Sig Dispense Refill  . amLODipine (NORVASC) 10 MG tablet Take 1 tablet (10 mg total) by mouth daily.  90 tablet  3  . glucose blood (ONE TOUCH ULTRA TEST) test strip Test blood sugar once daily dx: 250.60  100 each  3  . lisinopril-hydrochlorothiazide (PRINZIDE,ZESTORETIC) 20-25 MG per tablet TAKE ONE TABLET BY MOUTH ONCE DAILY  90 tablet  0  . omeprazole (PRILOSEC) 20 MG capsule Take 20 mg by mouth daily.      . pravastatin (PRAVACHOL) 20 MG tablet Take 1 tablet by mouth  daily  90 tablet  3  . vitamin B-12 (CYANOCOBALAMIN) 1000 MCG tablet Take 1,000 mcg by mouth daily.       No current facility-administered medications on file prior to visit.    No Known Allergies  Past Medical History  Diagnosis Date  . Diabetes mellitus   . GERD (gastroesophageal reflux disease)   . Hypertension   . Hyperlipidemia     No past surgical history on file.  Family History  Problem Relation Age of Onset  . Diabetes Brother   . Cancer Maternal Aunt     breast cancer  . Cancer Maternal Uncle     throat  . Heart disease Neg Hx   . Diabetes Sister     History   Social History  . Marital Status: Married    Spouse Name: N/A   Number of Children: 1  . 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     [employer]      Social History Main Topics  . Smoking status: Never Smoker   . Smokeless tobacco: Never Used  . Alcohol Use: No  . Drug Use: No  . Sexual Activity: Not on file   Other Topics Concern  . Not on file   Social History Narrative   [employer]-- vegetarian                     Review of Systems Will get some pain in right wrist---may be from using crank on his truck. Has used brace and that helps. Sleeps well-- but will have nocturia x 2 at times (generally none though) Bowels are fine    Objective:   Physical Exam  Constitutional: He appears well-developed and well-nourished. No distress.  Neck: Normal range of motion. Neck supple. No thyromegaly present.  Cardiovascular: Normal rate, regular rhythm, normal heart sounds and intact distal pulses.  Exam reveals no gallop.   No murmur heard. Pulmonary/Chest: Effort normal and breath sounds normal. No respiratory distress. He has no wheezes. He has no rales.  Musculoskeletal:  He exhibits no edema and no tenderness.  Lymphadenopathy:    He has no cervical adenopathy.  Skin:  No foot lesions  Psychiatric: He has a normal mood and affect. His behavior is normal.          Assessment & Plan:

## 2014-05-08 NOTE — Assessment & Plan Note (Signed)
Improved Hasn't needed the omeprazole lately

## 2014-05-08 NOTE — Assessment & Plan Note (Signed)
No problems on statin 

## 2014-05-09 LAB — COMPREHENSIVE METABOLIC PANEL
ALK PHOS: 68 U/L (ref 39–117)
ALT: 18 U/L (ref 0–53)
AST: 17 U/L (ref 0–37)
Albumin: 4.6 g/dL (ref 3.5–5.2)
BUN: 9 mg/dL (ref 6–23)
CO2: 32 mEq/L (ref 19–32)
Calcium: 10.1 mg/dL (ref 8.4–10.5)
Chloride: 99 mEq/L (ref 96–112)
Creatinine, Ser: 0.8 mg/dL (ref 0.4–1.5)
GFR: 124.87 mL/min (ref >60.00)
GLUCOSE: 116 mg/dL — AB (ref 70–99)
POTASSIUM: 3.7 meq/L (ref 3.5–5.1)
SODIUM: 140 meq/L (ref 135–145)
TOTAL PROTEIN: 7.5 g/dL (ref 6.0–8.3)
Total Bilirubin: 0.6 mg/dL (ref 0.2–1.2)

## 2014-05-09 LAB — CBC WITH DIFFERENTIAL/PLATELET
Basophils Absolute: 0 10*3/uL (ref 0.0–0.1)
Basophils Relative: 0.5 % (ref 0.0–3.0)
EOS PCT: 1 % (ref 0.0–5.0)
Eosinophils Absolute: 0.1 10*3/uL (ref 0.0–0.7)
HCT: 48.8 % (ref 39.0–52.0)
Hemoglobin: 16.2 g/dL (ref 13.0–17.0)
LYMPHS ABS: 3.2 10*3/uL (ref 0.7–4.0)
Lymphocytes Relative: 40.9 % (ref 12.0–46.0)
MCHC: 33.1 g/dL (ref 30.0–36.0)
MCV: 94.8 fl (ref 78.0–100.0)
Monocytes Absolute: 0.5 10*3/uL (ref 0.1–1.0)
Monocytes Relative: 5.7 % (ref 3.0–12.0)
Neutro Abs: 4.1 10*3/uL (ref 1.4–7.7)
Neutrophils Relative %: 51.9 % (ref 43.0–77.0)
PLATELETS: 248 10*3/uL (ref 150.0–400.0)
RBC: 5.15 Mil/uL (ref 4.22–5.81)
RDW: 12.3 % (ref 11.5–15.5)
WBC: 7.9 10*3/uL (ref 4.0–10.5)

## 2014-05-09 LAB — LIPID PANEL
Cholesterol: 147 mg/dL (ref 0–200)
HDL: 46.2 mg/dL (ref >39.00)
LDL CALC: 86 mg/dL (ref 0–99)
NonHDL: 100.8
Total CHOL/HDL Ratio: 3
Triglycerides: 75 mg/dL (ref 0.0–149.0)
VLDL: 15 mg/dL (ref 0.0–40.0)

## 2014-05-09 LAB — T4, FREE: FREE T4: 0.82 ng/dL (ref 0.60–1.60)

## 2014-05-09 LAB — HEMOGLOBIN A1C: HEMOGLOBIN A1C: 6.9 % — AB (ref 4.6–6.5)

## 2014-05-09 LAB — PSA: PSA: 0.74 ng/mL (ref 0.10–4.00)

## 2014-05-15 ENCOUNTER — Encounter: Payer: Self-pay | Admitting: Internal Medicine

## 2014-05-15 ENCOUNTER — Ambulatory Visit (INDEPENDENT_AMBULATORY_CARE_PROVIDER_SITE_OTHER): Payer: 59 | Admitting: Internal Medicine

## 2014-05-15 VITALS — BP 130/80 | HR 86 | Temp 97.9°F | Wt 227.0 lb

## 2014-05-15 DIAGNOSIS — M79609 Pain in unspecified limb: Secondary | ICD-10-CM

## 2014-05-15 DIAGNOSIS — M79621 Pain in right upper arm: Secondary | ICD-10-CM

## 2014-05-15 NOTE — Progress Notes (Signed)
Pre visit review using our clinic review tool, if applicable. No additional management support is needed unless otherwise documented below in the visit note. 

## 2014-05-15 NOTE — Assessment & Plan Note (Signed)
No worrisome findings Reassured  ?cyst, vs muscle tear No nodes Observe only

## 2014-05-15 NOTE — Progress Notes (Signed)
   Subjective:    Patient ID: Hunter Richmond, male    DOB: July 29, 1961, 53 y.o.   MRN: [id number]  HPI Noticed lump under his right arm yesterday Did physical labor 3-4 days ago--but no injury Was painful ---in lump and axilla  Already some better No treatment  No bites No skin rashes or spots in arm  Current Outpatient Prescriptions on File Prior to Visit  Medication Sig Dispense Refill  . amLODipine (NORVASC) 10 MG tablet Take 1 tablet (10 mg total) by mouth daily.  90 tablet  3  . glucose blood (ONE TOUCH ULTRA TEST) test strip Test blood sugar once daily dx: 250.60  100 each  3  . lisinopril-hydrochlorothiazide (PRINZIDE,ZESTORETIC) 20-25 MG per tablet Take 1 tablet by mouth daily.  90 tablet  3  . omeprazole (PRILOSEC) 20 MG capsule Take 20 mg by mouth daily as needed.       . pravastatin (PRAVACHOL) 20 MG tablet Take 1 tablet by mouth  daily  90 tablet  3  . vitamin B-12 (CYANOCOBALAMIN) 1000 MCG tablet Take 1,000 mcg by mouth daily.       No current facility-administered medications on file prior to visit.    No Known Allergies  Past Medical History  Diagnosis Date  . Diabetes mellitus   . GERD (gastroesophageal reflux disease)   . Hypertension   . Hyperlipidemia     No past surgical history on file.  Family History  Problem Relation Age of Onset  . Diabetes Brother   . Cancer Maternal Aunt     breast cancer  . Cancer Maternal Uncle     throat  . Heart disease Neg Hx   . Diabetes Sister     History   Social History  . Marital Status: Married    Spouse Name: N/A    Number of Children: 1  . 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     [employer]      Social History Main Topics  . Smoking status: Never Smoker   . Smokeless tobacco: Never Used  . Alcohol Use: No  . Drug Use: No  . Sexual Activity: Not on file   Other Topics Concern  . Not on file   Social History Narrative   [employer]-- vegetarian                     Review of Systems No fever No N/V Appetite is okay     Objective:   Physical Exam  Constitutional: He appears well-developed and well-nourished. No distress.  Musculoskeletal:  Mild tenderness in right axilla but no nodes. Slight tenderness with some fullness along pectoral border--but no clear cyst or muscle injury  Lymphadenopathy:    He has no axillary adenopathy.  Skin: No rash noted. No erythema.  Psychiatric:  No right hand or arm lesions          Assessment & Plan:

## 2014-05-16 ENCOUNTER — Other Ambulatory Visit (INDEPENDENT_AMBULATORY_CARE_PROVIDER_SITE_OTHER): Payer: 59

## 2014-05-16 DIAGNOSIS — Z1211 Encounter for screening for malignant neoplasm of colon: Secondary | ICD-10-CM

## 2014-05-16 LAB — FECAL OCCULT BLOOD, IMMUNOCHEMICAL: FECAL OCCULT BLD: NEGATIVE

## 2014-07-07 ENCOUNTER — Other Ambulatory Visit: Payer: Self-pay | Admitting: Internal Medicine

## 2014-09-20 ENCOUNTER — Other Ambulatory Visit: Payer: Self-pay | Admitting: Internal Medicine

## 2014-11-07 ENCOUNTER — Encounter: Payer: Self-pay | Admitting: Family Medicine

## 2014-11-07 ENCOUNTER — Ambulatory Visit (INDEPENDENT_AMBULATORY_CARE_PROVIDER_SITE_OTHER): Payer: BLUE CROSS/BLUE SHIELD | Admitting: Family Medicine

## 2014-11-07 VITALS — BP 118/78 | HR 69 | Temp 98.1°F | Ht 74.5 in | Wt 218.8 lb

## 2014-11-07 DIAGNOSIS — B9789 Other viral agents as the cause of diseases classified elsewhere: Principal | ICD-10-CM

## 2014-11-07 DIAGNOSIS — J069 Acute upper respiratory infection, unspecified: Secondary | ICD-10-CM | POA: Insufficient documentation

## 2014-11-07 NOTE — Progress Notes (Signed)
Subjective:    Patient ID: Hunter Richmond, male    DOB: 02-Dec-1960, 54 y.o.   MRN: [id number]  HPI Here with uri symptoms  Started Tuesday - by Thursday  Coughing -with phlegm (green in color) , no wheezing or trouble breathing  Fever - ? How high- hot and cold and aches and chills and sweats -- improved but not gone  Still some drainage   Concerned about diabetes -his blood sugar became irratic and quite high  He usually exercises every day  Now back to exercise-blood sugar is not coming down like it usually is (finally today)   His blood sugar got as high as 300   Had a flu shot this season   Patient Active Problem List   Diagnosis Date Noted  . Pain in right axilla 05/15/2014  . Diabetic polyneuropathy 05/08/2014  . Routine general medical examination at a health care facility 04/14/2012  . HYPERLIPIDEMIA 01/18/2008  . Type II or unspecified type diabetes mellitus with neurological manifestations, not stated as uncontrolled(250.60) 07/20/2007  . HYPERTENSION 07/20/2007  . GERD 07/20/2007   Past Medical History  Diagnosis Date  . Diabetes mellitus   . GERD (gastroesophageal reflux disease)   . Hypertension   . Hyperlipidemia    No past surgical history on file. History  Substance Use Topics  . Smoking status: Never Smoker   . Smokeless tobacco: Never Used  . Alcohol Use: No   Family History  Problem Relation Age of Onset  . Diabetes Brother   . Cancer Maternal Aunt     breast cancer  . Cancer Maternal Uncle     throat  . Heart disease Neg Hx   . Diabetes Sister    No Known Allergies Current Outpatient Prescriptions on File Prior to Visit  Medication Sig Dispense Refill  . amLODipine (NORVASC) 10 MG tablet Take 1 tablet by mouth  daily 90 tablet 3  . glucose blood (ONE TOUCH ULTRA TEST) test strip Use to test blood sugar once daily dx: E11.49 100 each 3  . lisinopril-hydrochlorothiazide (PRINZIDE,ZESTORETIC) 20-25 MG per tablet Take 1 tablet by mouth daily.  90 tablet 3  . omeprazole (PRILOSEC) 20 MG capsule Take 20 mg by mouth daily as needed.     . pravastatin (PRAVACHOL) 20 MG tablet Take 1 tablet by mouth  daily 90 tablet 3  . vitamin B-12 (CYANOCOBALAMIN) 1000 MCG tablet Take 1,000 mcg by mouth daily.     No current facility-administered medications on file prior to visit.     Review of Systems    Review of Systems  Constitutional: Negative for fever, appetite change, and unexpected weight change. pos for fatigue ENT pos for cong and rhinorrhea/neg for sinus pain  Eyes: Negative for pain and visual disturbance.  Respiratory: Negative for wheeze  and shortness of breath.   Cardiovascular: Negative for cp or palpitations    Gastrointestinal: Negative for nausea, diarrhea and constipation.  Genitourinary: Negative for urgency and frequency.  Skin: Negative for pallor or rash   Neurological: Negative for weakness, light-headedness, numbness and headaches.  Hematological: Negative for adenopathy. Does not bruise/bleed easily.  Psychiatric/Behavioral: Negative for dysphoric mood. The patient is not nervous/anxious.      Objective:   Physical Exam  Constitutional: He appears well-developed and well-nourished. No distress.  HENT:  Head: Normocephalic and atraumatic.  Right Ear: External ear normal.  Left Ear: External ear normal.  Mouth/Throat: Oropharynx is clear and moist. No oropharyngeal exudate.  Nares are injected  and congested  No sinus tenderness Clear rhinorrhea   Eyes: Conjunctivae and EOM are normal. Pupils are equal, round, and reactive to light. Right eye exhibits no discharge. Left eye exhibits no discharge.  Neck: Normal range of motion. Neck supple.  Cardiovascular: Normal rate and regular rhythm.   Pulmonary/Chest: Effort normal and breath sounds normal. No respiratory distress. He has no wheezes. He has no rales. He exhibits no tenderness.  Lymphadenopathy:    He has no cervical adenopathy.  Skin: Skin is warm and  dry. No rash noted.  Psychiatric: He has a normal mood and affect.          Assessment & Plan:   Problem List Items Addressed This Visit      Respiratory   Viral URI with cough - Primary    After one week now much improved  Reassuring exam - disc symptom control Disc symptomatic care - see instructions on AVS  Will continue an expectorant   Disc blood sugar- expected high while sick-is coming back   Update if not starting to improve in a week or if worsening

## 2014-11-07 NOTE — Patient Instructions (Signed)
I think you have had a viral upper respiratory infection or the flu  Drink lots of water  Get lots of rest  mucinex or robitussin help congestion and cough  Tylenol (acetaminophen) for fever or aches  Your blood sugar should start coming down soon   Update if not starting to improve in a week or if worsening

## 2014-11-07 NOTE — Progress Notes (Signed)
Pre visit review using our clinic review tool, if applicable. No additional management support is needed unless otherwise documented below in the visit note. 

## 2014-11-07 NOTE — Assessment & Plan Note (Signed)
After one week now much improved  Reassuring exam - disc symptom control Disc symptomatic care - see instructions on AVS  Will continue an expectorant   Disc blood sugar- expected high while sick-is coming back   Update if not starting to improve in a week or if worsening

## 2014-11-09 ENCOUNTER — Ambulatory Visit: Payer: 59 | Admitting: Internal Medicine

## 2014-11-10 ENCOUNTER — Ambulatory Visit: Payer: Self-pay | Admitting: Internal Medicine

## 2014-11-14 ENCOUNTER — Encounter: Payer: Self-pay | Admitting: Internal Medicine

## 2014-11-14 ENCOUNTER — Ambulatory Visit (INDEPENDENT_AMBULATORY_CARE_PROVIDER_SITE_OTHER): Payer: BLUE CROSS/BLUE SHIELD | Admitting: Internal Medicine

## 2014-11-14 VITALS — BP 110/80 | HR 84 | Temp 98.0°F | Wt 216.8 lb

## 2014-11-14 DIAGNOSIS — E785 Hyperlipidemia, unspecified: Secondary | ICD-10-CM

## 2014-11-14 DIAGNOSIS — E1142 Type 2 diabetes mellitus with diabetic polyneuropathy: Secondary | ICD-10-CM

## 2014-11-14 DIAGNOSIS — E1140 Type 2 diabetes mellitus with diabetic neuropathy, unspecified: Secondary | ICD-10-CM

## 2014-11-14 DIAGNOSIS — E1149 Type 2 diabetes mellitus with other diabetic neurological complication: Secondary | ICD-10-CM

## 2014-11-14 DIAGNOSIS — I10 Essential (primary) hypertension: Secondary | ICD-10-CM

## 2014-11-14 LAB — HEMOGLOBIN A1C: HEMOGLOBIN A1C: 8.5 % — AB (ref 4.6–6.5)

## 2014-11-14 LAB — HM DIABETES FOOT EXAM

## 2014-11-14 NOTE — Assessment & Plan Note (Signed)
Seems to still have good control Due for A1c

## 2014-11-14 NOTE — Patient Instructions (Signed)
Please set up your eye exam and have them send me a report.

## 2014-11-14 NOTE — Progress Notes (Signed)
Pre visit review using our clinic review tool, if applicable. No additional management support is needed unless otherwise documented below in the visit note. 

## 2014-11-14 NOTE — Progress Notes (Signed)
Subjective:    Patient ID: Hunter Richmond, male    DOB: Aug 27, 1961, 54 y.o.   MRN: [id number]  HPI Here for follow up of diabetes and other chronic medical conditions  Mostly over the cold Sugars were really erratic while he was sick but now leveling out Back to his regular exercise Still watching carbs and limiting meat (so taking B12)  Checks sugars just about daily Usually ~120 fasting and lower at times No hypoglycemic reactions No numbness or pain in feet---tingling that he had seems to be gone  No chest pain No palpitations lately No dizziness or syncope No headaches  Not taking the prilosec lately No reflux No swallowing problems  No problems with statin No myalgias  Current Outpatient Prescriptions on File Prior to Visit  Medication Sig Dispense Refill  . amLODipine (NORVASC) 10 MG tablet Take 1 tablet by mouth  daily 90 tablet 3  . glucose blood (ONE TOUCH ULTRA TEST) test strip Use to test blood sugar once daily dx: E11.49 100 each 3  . lisinopril-hydrochlorothiazide (PRINZIDE,ZESTORETIC) 20-25 MG per tablet Take 1 tablet by mouth daily. 90 tablet 3  . omeprazole (PRILOSEC) 20 MG capsule Take 20 mg by mouth daily as needed.     . pravastatin (PRAVACHOL) 20 MG tablet Take 1 tablet by mouth  daily 90 tablet 3  . vitamin B-12 (CYANOCOBALAMIN) 1000 MCG tablet Take 1,000 mcg by mouth daily.     No current facility-administered medications on file prior to visit.    No Known Allergies  Past Medical History  Diagnosis Date  . Diabetes mellitus   . GERD (gastroesophageal reflux disease)   . Hypertension   . Hyperlipidemia     No past surgical history on file.  Family History  Problem Relation Age of Onset  . Diabetes Brother   . Cancer Maternal Aunt     breast cancer  . Cancer Maternal Uncle     throat  . Heart disease Neg Hx   . Diabetes Sister     History   Social History  . Marital Status: Married    Spouse Name: N/A  . Number of Children: 1    . 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     [employer]      Social History Main Topics  . Smoking status: Never Smoker   . Smokeless tobacco: Never Used  . Alcohol Use: No  . Drug Use: No  . Sexual Activity: Not on file   Other Topics Concern  . Not on file   Social History Narrative   [employer]-- vegetarian                     Review of Systems Mild right wrist pain--relates to work Weight is back down 5# since 6 months ago Sleeping okay--now working nights so that has affected his sleep cycle    Objective:   Physical Exam  Constitutional: He appears well-developed and well-nourished. No distress.  Neck: Normal range of motion. Neck supple. No thyromegaly present.  Cardiovascular: Normal rate, regular rhythm, normal heart sounds and intact distal pulses.  Exam reveals no gallop.   No murmur heard. Pulmonary/Chest: Effort normal and breath sounds normal. No respiratory distress. He has no wheezes. He has no rales.  Abdominal: Soft. There is no tenderness.  Musculoskeletal: He exhibits no edema or tenderness.  Lymphadenopathy:  He has no cervical adenopathy.  Neurological:  Missed only 2/8 monofilament testing points  Skin:  No foot lesions  Psychiatric: He has a normal mood and affect. His behavior is normal.          Assessment & Plan:

## 2014-11-14 NOTE — Assessment & Plan Note (Signed)
This actually seems to have improved--perhaps due to his assiduous control

## 2014-11-14 NOTE — Assessment & Plan Note (Signed)
BP Readings from Last 3 Encounters:  11/14/14 110/80  11/07/14 118/78  05/15/14 130/80   Good control Will continue ACEI

## 2014-11-14 NOTE — Assessment & Plan Note (Signed)
No problems with statin Lab Results  Component Value Date   LDLCALC 86 05/08/2014

## 2014-11-15 ENCOUNTER — Telehealth: Payer: Self-pay

## 2014-11-15 MED ORDER — METFORMIN HCL ER 500 MG PO TB24: ORAL_TABLET | ORAL | Status: DC

## 2014-11-15 NOTE — Telephone Encounter (Signed)
-----   Message from Venia Carbon, MD sent at 11/15/2014  7:59 AM EST ----- Results released on MyChart He needs to start metformin ER 500mg . Start with 1 daily and increase to 2 daily after 2 weeks if fasting sugars still over 120 consistently. Take with breakfast (Rx x 1 year) Set him up for repeat A1c in about 3 months

## 2014-11-15 NOTE — Telephone Encounter (Signed)
Pt left v/m wanting to change pharmacy to [facility] on [address]; spoke with pt and advised could have rx transferred from [facility] to [place]. Pt said he has changed his mind and for now leave [facility] at [facility].

## 2014-11-15 NOTE — Telephone Encounter (Signed)
Metformin rx sent to pharmacy

## 2014-12-22 ENCOUNTER — Other Ambulatory Visit: Payer: Self-pay

## 2014-12-22 MED ORDER — AMLODIPINE BESYLATE 10 MG PO TABS: ORAL_TABLET | ORAL | Status: DC

## 2014-12-22 MED ORDER — PRAVASTATIN SODIUM 20 MG PO TABS: ORAL_TABLET | ORAL | Status: DC

## 2014-12-22 NOTE — Telephone Encounter (Signed)
Pt request refill amlodipine and pravastatin to [facility] [address]. Pt not using [facility] now due to ins change. Pt notified refills done.

## 2015-06-26 ENCOUNTER — Other Ambulatory Visit: Payer: Self-pay | Admitting: Internal Medicine

## 2015-06-29 ENCOUNTER — Ambulatory Visit (INDEPENDENT_AMBULATORY_CARE_PROVIDER_SITE_OTHER): Payer: Commercial Managed Care - PPO | Admitting: Internal Medicine

## 2015-06-29 ENCOUNTER — Other Ambulatory Visit: Payer: Self-pay | Admitting: Internal Medicine

## 2015-06-29 ENCOUNTER — Encounter: Payer: BLUE CROSS/BLUE SHIELD | Admitting: Internal Medicine

## 2015-06-29 ENCOUNTER — Encounter: Payer: Self-pay | Admitting: Internal Medicine

## 2015-06-29 VITALS — BP 122/80 | HR 75 | Temp 98.2°F | Ht 75.0 in | Wt 228.0 lb

## 2015-06-29 DIAGNOSIS — Z23 Encounter for immunization: Secondary | ICD-10-CM

## 2015-06-29 DIAGNOSIS — E785 Hyperlipidemia, unspecified: Secondary | ICD-10-CM | POA: Diagnosis not present

## 2015-06-29 DIAGNOSIS — E119 Type 2 diabetes mellitus without complications: Secondary | ICD-10-CM

## 2015-06-29 DIAGNOSIS — Z0000 Encounter for general adult medical examination without abnormal findings: Secondary | ICD-10-CM | POA: Diagnosis not present

## 2015-06-29 DIAGNOSIS — Z1211 Encounter for screening for malignant neoplasm of colon: Secondary | ICD-10-CM

## 2015-06-29 DIAGNOSIS — I10 Essential (primary) hypertension: Secondary | ICD-10-CM

## 2015-06-29 LAB — COMPREHENSIVE METABOLIC PANEL
ALBUMIN: 4.5 g/dL (ref 3.5–5.2)
ALT: 14 U/L (ref 0–53)
AST: 16 U/L (ref 0–37)
Alkaline Phosphatase: 68 U/L (ref 39–117)
BILIRUBIN TOTAL: 0.6 mg/dL (ref 0.2–1.2)
BUN: 11 mg/dL (ref 6–23)
CALCIUM: 9.9 mg/dL (ref 8.4–10.5)
CO2: 31 meq/L (ref 19–32)
CREATININE: 0.91 mg/dL (ref 0.40–1.50)
Chloride: 99 mEq/L (ref 96–112)
GFR: 111.81 mL/min (ref >60.00)
Glucose, Bld: 108 mg/dL — ABNORMAL HIGH (ref 70–99)
Potassium: 3.6 mEq/L (ref 3.5–5.1)
Sodium: 139 mEq/L (ref 135–145)
Total Protein: 7.6 g/dL (ref 6.0–8.3)

## 2015-06-29 LAB — LIPID PANEL
CHOL/HDL RATIO: 4
CHOLESTEROL: 137 mg/dL (ref 0–200)
HDL: 38.8 mg/dL — AB (ref >39.00)
LDL Cholesterol: 78 mg/dL (ref 0–99)
NonHDL: 98.17
TRIGLYCERIDES: 103 mg/dL (ref 0.0–149.0)
VLDL: 20.6 mg/dL (ref 0.0–40.0)

## 2015-06-29 LAB — CBC WITH DIFFERENTIAL/PLATELET
BASOS ABS: 0 10*3/uL (ref 0.0–0.1)
Basophils Relative: 0.4 % (ref 0.0–3.0)
EOS ABS: 0 10*3/uL (ref 0.0–0.7)
Eosinophils Relative: 0.6 % (ref 0.0–5.0)
HEMATOCRIT: 47.4 % (ref 39.0–52.0)
Hemoglobin: 15.8 g/dL (ref 13.0–17.0)
LYMPHS PCT: 40.7 % (ref 12.0–46.0)
Lymphs Abs: 3.1 10*3/uL (ref 0.7–4.0)
MCHC: 33.3 g/dL (ref 30.0–36.0)
MCV: 94 fl (ref 78.0–100.0)
MONO ABS: 0.5 10*3/uL (ref 0.1–1.0)
Monocytes Relative: 6.3 % (ref 3.0–12.0)
NEUTROS ABS: 4 10*3/uL (ref 1.4–7.7)
NEUTROS PCT: 52 % (ref 43.0–77.0)
PLATELETS: 272 10*3/uL (ref 150.0–400.0)
RBC: 5.05 Mil/uL (ref 4.22–5.81)
RDW: 12.9 % (ref 11.5–15.5)
WBC: 7.7 10*3/uL (ref 4.0–10.5)

## 2015-06-29 LAB — HM DIABETES FOOT EXAM

## 2015-06-29 LAB — HEMOGLOBIN A1C: Hgb A1c MFr Bld: 6.8 % — ABNORMAL HIGH (ref 4.6–6.5)

## 2015-06-29 NOTE — Progress Notes (Signed)
Pre visit review using our clinic review tool, if applicable. No additional management support is needed unless otherwise documented below in the visit note. 

## 2015-06-29 NOTE — Assessment & Plan Note (Signed)
BP Readings from Last 3 Encounters:  06/29/15 122/80  11/14/14 110/80  11/07/14 118/78   Good control

## 2015-06-29 NOTE — Addendum Note (Signed)
Addended by: Despina Hidden on: 06/29/2015 11:43 AM   Modules accepted: Orders

## 2015-06-29 NOTE — Assessment & Plan Note (Signed)
No problems with statin 

## 2015-06-29 NOTE — Assessment & Plan Note (Signed)
Healthy but needs to work on the weight gain Fecal immunoassay Defer PSA to at least next year Flu vaccine

## 2015-06-29 NOTE — Assessment & Plan Note (Signed)
Hopefully better with the metformin Scheduling eye exam

## 2015-06-29 NOTE — Progress Notes (Signed)
Subjective:    Patient ID: Hunter Richmond, male    DOB: 1960-10-08, 54 y.o.   MRN: [id number]  HPI Here for physical  Has done well on the metformin Checks sugars regularly Usually 90-110 fasting Has been trying to exercise more Has been doing weight training Overdue for eye exam--working on scheduling it No numbness, sores or foot lesions  Current Outpatient Prescriptions on File Prior to Visit  Medication Sig Dispense Refill  . amLODipine (NORVASC) 10 MG tablet TAKE ONE TABLET BY MOUTH ONCE DAILY 90 tablet 0  . glucose blood (ONE TOUCH ULTRA TEST) test strip Use to test blood sugar once daily dx: E11.49 100 each 3  . lisinopril-hydrochlorothiazide (PRINZIDE,ZESTORETIC) 20-25 MG tablet TAKE ONE TABLET BY MOUTH ONCE DAILY 90 tablet 0  . metFORMIN (GLUCOPHAGE XR) 500 MG 24 hr tablet Take 1 tablet daily with breakfast, increase to 2 tablets after 2 weeks if glucose readings are above 120. 180 tablet 3  . pravastatin (PRAVACHOL) 20 MG tablet TAKE ONE TABLET BY MOUTH ONCE DAILY 90 tablet 0  . vitamin B-12 (CYANOCOBALAMIN) 1000 MCG tablet Take 1,000 mcg by mouth daily.     No current facility-administered medications on file prior to visit.    No Known Allergies  Past Medical History  Diagnosis Date  . Diabetes mellitus   . GERD (gastroesophageal reflux disease)   . Hypertension   . Hyperlipidemia     No past surgical history on file.  Family History  Problem Relation Age of Onset  . Diabetes Brother   . Cancer Maternal Aunt     breast cancer  . Cancer Maternal Uncle     throat  . Heart disease Neg Hx   . Diabetes Sister     Social History   Social History  . Marital Status: Married    Spouse Name: N/A  . Number of Children: 1  . 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     [employer]      Social History Main Topics  . Smoking status: Never Smoker   . Smokeless tobacco: Never Used  . Alcohol Use: No    . Drug Use: No  . Sexual Activity: Not on file   Other Topics Concern  . Not on file   Social History Narrative   [employer]-- vegetarian                     Review of Systems  Constitutional: Positive for unexpected weight change. Negative for fatigue.       Weight up 10#---will try to be more careful Wears seat belt  HENT: Negative for dental problem, hearing loss and tinnitus.        Keeps up with dentist  Eyes: Negative for visual disturbance.       No diplopia or unilateral vision loss  Respiratory: Negative for cough, chest tightness and shortness of breath.   Cardiovascular: Negative for chest pain and palpitations.       Occ very slight right foot swelling--always better in AM  Gastrointestinal: Negative for nausea, vomiting, abdominal pain, constipation and blood in stool.       Rare heartburn-- uses OTC with good result   Endocrine: Negative for polydipsia and polyuria.  Genitourinary: Negative for urgency and difficulty urinating.       No sexual problems  Musculoskeletal: Positive for arthralgias. Negative for back pain  and joint swelling.       Right shoulder pain--may have overdone the weight training  Skin: Negative for rash.       Dry skin is better  Allergic/Immunologic: Positive for environmental allergies. Negative for immunocompromised state.       Doesn't use meds   Neurological: Positive for numbness. Negative for syncope, weakness, light-headedness and headaches.       Numbness is not frequent now  Hematological: Negative for adenopathy. Does not bruise/bleed easily.  Psychiatric/Behavioral: Negative for sleep disturbance and dysphoric mood. The patient is not nervous/anxious.        Sleeps well despite working 3rd shift       Objective:   Physical Exam  Constitutional: He appears well-developed and well-nourished. No distress.  HENT:  Head: Normocephalic and atraumatic.  Right Ear: External ear normal.  Left Ear: External ear  normal.  Mouth/Throat: Oropharynx is clear and moist. No oropharyngeal exudate.  Eyes: Conjunctivae and EOM are normal. Pupils are equal, round, and reactive to light.  Neck: Normal range of motion. Neck supple. No thyromegaly present.  Cardiovascular: Normal rate, normal heart sounds and intact distal pulses.  Exam reveals no gallop.   No murmur heard. Pulmonary/Chest: Effort normal and breath sounds normal. No respiratory distress. He has no wheezes. He has no rales.  Abdominal: Soft. There is no tenderness.  Musculoskeletal: He exhibits no edema or tenderness.  Lymphadenopathy:    He has no cervical adenopathy.  Neurological:  Sensation intact to monofilament in feet  Skin: No rash noted. No erythema.  No foot lesions  Psychiatric: He has a normal mood and affect. His behavior is normal.          Assessment & Plan:

## 2015-09-07 ENCOUNTER — Other Ambulatory Visit: Payer: Self-pay | Admitting: Internal Medicine

## 2015-11-07 ENCOUNTER — Encounter: Payer: Self-pay | Admitting: Family Medicine

## 2015-11-07 ENCOUNTER — Ambulatory Visit (INDEPENDENT_AMBULATORY_CARE_PROVIDER_SITE_OTHER): Payer: Commercial Managed Care - PPO | Admitting: Family Medicine

## 2015-11-07 VITALS — BP 118/70 | HR 88 | Temp 98.8°F | Wt 229.8 lb

## 2015-11-07 DIAGNOSIS — J069 Acute upper respiratory infection, unspecified: Secondary | ICD-10-CM

## 2015-11-07 NOTE — Progress Notes (Signed)
SUBJECTIVE:  Hunter Richmond is a 55 y.o. male pt of Dr. Silvio Pate, new to me, who complains of coryza, congestion, sore throat, myalgias and headache for 2 days. He denies a history of anorexia and chest pain and denies a history of asthma. Patient denies smoke cigarettes.   Tylenol ES has been helping with symptoms.  He is a diabetic- FSBS have been ok.  Current Outpatient Prescriptions on File Prior to Visit  Medication Sig Dispense Refill  . amLODipine (NORVASC) 10 MG tablet TAKE ONE TABLET BY MOUTH ONCE DAILY 90 tablet 3  . glucose blood (ONE TOUCH ULTRA TEST) test strip Use to test blood sugar once daily dx: E11.49 100 each 3  . lisinopril-hydrochlorothiazide (PRINZIDE,ZESTORETIC) 20-25 MG tablet TAKE ONE TABLET BY MOUTH ONCE DAILY 90 tablet 3  . metFORMIN (GLUCOPHAGE-XR) 500 MG 24 hr tablet Take 1-2 tablets (500-1,000 mg total) by mouth daily with breakfast. 180 tablet 3  . pravastatin (PRAVACHOL) 20 MG tablet TAKE ONE TABLET BY MOUTH ONCE DAILY 90 tablet 3  . vitamin B-12 (CYANOCOBALAMIN) 1000 MCG tablet Take 1,000 mcg by mouth daily.     No current facility-administered medications on file prior to visit.    No Known Allergies  Past Medical History  Diagnosis Date  . Diabetes mellitus   . GERD (gastroesophageal reflux disease)   . Hypertension   . Hyperlipidemia     No past surgical history on file.  Family History  Problem Relation Age of Onset  . Diabetes Brother   . Cancer Maternal Aunt     breast cancer  . Cancer Maternal Uncle     throat  . Heart disease Neg Hx   . Diabetes Sister     Social History   Social History  . Marital Status: Married    Spouse Name: N/A  . Number of Children: 1  . 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     [employer]      Social History Main Topics  . Smoking status: Never Smoker   . Smokeless tobacco: Never Used  . Alcohol Use: No  . Drug Use: No  . Sexual Activity:  Not on file   Other Topics Concern  . Not on file   Social History Narrative   [employer]-- vegetarian                     The PMH, PSH, Social History, Family History, Medications, and allergies have been reviewed in [facility], and have been updated if relevant.  OBJECTIVE: BP 118/70 mmHg  Pulse 88  Temp(Src) 98.8 F (37.1 C) (Oral)  Wt 229 lb 12 oz (104.214 kg)  SpO2 96%  He appears well, vital signs are as noted. Ears normal.  Throat and pharynx normal.  Neck supple. No adenopathy in the neck. Nose is congested. Sinuses non tender. The chest is clear, without wheezes or rales.  ASSESSMENT:  viral upper respiratory illness  PLAN: Symptomatic therapy suggested: push fluids, rest and return office visit prn if symptoms persist or worsen. Lack of antibiotic effectiveness discussed with him. Call or return to clinic prn if these symptoms worsen or fail to improve as anticipated.

## 2015-11-07 NOTE — Patient Instructions (Signed)
Nice to meet you.  Happy Birthday! Drink lots of fluids.    Treat sympotmatically with Mucinex, nasal saline irrigation, and Tylenol/Ibuprofen.    Call if not improving as expected in 5-7 days.

## 2015-11-07 NOTE — Progress Notes (Signed)
Pre visit review using our clinic review tool, if applicable. No additional management support is needed unless otherwise documented below in the visit note. 

## 2015-11-27 ENCOUNTER — Other Ambulatory Visit: Payer: Self-pay | Admitting: Internal Medicine

## 2015-11-28 NOTE — Telephone Encounter (Signed)
Spoke to patient to clear up how many he was taking. He said he takes 2 a day. Not 4 a day as the refill from [facility] is asking for. I advised patient I would contact [place] [facility] to transfer his rx he has on file at [facility].

## 2015-11-28 NOTE — Telephone Encounter (Signed)
Spoke to [facility] at [facility] [employer]. He will call [facility] to transfer his rx. He will make sure the sig states 2 a day

## 2015-12-31 ENCOUNTER — Ambulatory Visit: Payer: Commercial Managed Care - PPO | Admitting: Internal Medicine

## 2016-01-04 ENCOUNTER — Ambulatory Visit (INDEPENDENT_AMBULATORY_CARE_PROVIDER_SITE_OTHER): Payer: Commercial Managed Care - PPO | Admitting: Internal Medicine

## 2016-01-04 ENCOUNTER — Encounter: Payer: Self-pay | Admitting: Internal Medicine

## 2016-01-04 ENCOUNTER — Encounter: Payer: Self-pay | Admitting: Gastroenterology

## 2016-01-04 VITALS — BP 108/80 | HR 64 | Temp 98.0°F | Wt 233.0 lb

## 2016-01-04 DIAGNOSIS — Z1211 Encounter for screening for malignant neoplasm of colon: Secondary | ICD-10-CM

## 2016-01-04 DIAGNOSIS — E119 Type 2 diabetes mellitus without complications: Secondary | ICD-10-CM

## 2016-01-04 LAB — HEMOGLOBIN A1C: Hgb A1c MFr Bld: 6.6 % — ABNORMAL HIGH (ref 4.6–6.5)

## 2016-01-04 MED ORDER — GLUCOSE BLOOD VI STRP: ORAL_STRIP | Status: DC

## 2016-01-04 NOTE — Progress Notes (Signed)
   Subjective:    Patient ID: Hunter Richmond, male    DOB: [birth year], 55 y.o.   MRN: [id number]  HPI Here for follow up of diabetes  Has gained some weight Overdue for eye appointment--he plans to take care of it  Eating a little more Still exercises regularly---walks 4 miles most days at [place]  No chest pain No SOB No dizziness or syncope  Checks sugars in AM Usually around 100--may be 80-90 at times Now on 2 metformin  Current Outpatient Prescriptions on File Prior to Visit  Medication Sig Dispense Refill  . amLODipine (NORVASC) 10 MG tablet TAKE ONE TABLET BY MOUTH ONCE DAILY 90 tablet 3  . glucose blood (ONE TOUCH ULTRA TEST) test strip Use to test blood sugar once daily dx: E11.49 100 each 3  . lisinopril-hydrochlorothiazide (PRINZIDE,ZESTORETIC) 20-25 MG tablet TAKE ONE TABLET BY MOUTH ONCE DAILY 90 tablet 3  . metFORMIN (GLUCOPHAGE-XR) 500 MG 24 hr tablet Take 1-2 tablets (500-1,000 mg total) by mouth daily with breakfast. (Patient taking differently: Take 1,000 mg by mouth daily with breakfast. ) 180 tablet 3  . pravastatin (PRAVACHOL) 20 MG tablet TAKE ONE TABLET BY MOUTH ONCE DAILY 90 tablet 3  . vitamin B-12 (CYANOCOBALAMIN) 1000 MCG tablet Take 1,000 mcg by mouth daily.     No current facility-administered medications on file prior to visit.    No Known Allergies  Past Medical History  Diagnosis Date  . Diabetes mellitus   . GERD (gastroesophageal reflux disease)   . Hypertension   . Hyperlipidemia     No past surgical history on file.  Family History  Problem Relation Age of Onset  . Diabetes Brother   . Cancer Maternal Aunt     breast cancer  . Cancer Maternal Uncle     throat  . Heart disease Neg Hx   . Diabetes Sister     Social History   Social History  . Marital Status: Married    Spouse Name: N/A  . Number of Children: 1  . Years of Education: N/A   Occupational History  . Truck driver     short and  occasionally overnight  .     [employer]      Social History Main Topics  . Smoking status: Never Smoker   . Smokeless tobacco: Never Used  . Alcohol Use: No  . Drug Use: No  . Sexual Activity: Not on file   Other Topics Concern  . Not on file   Social History Narrative   [employer]-- vegetarian                     Review of Systems Appetite good Sleeps fairly well--but maybe not enough due to 3rd shift    Objective:   Physical Exam  Constitutional: He appears well-developed and well-nourished. No distress.  Neck: Normal range of motion. Neck supple. No thyromegaly present.  Cardiovascular: Normal rate, regular rhythm, normal heart sounds and intact distal pulses.  Exam reveals no gallop.   No murmur heard. Pulmonary/Chest: Effort normal and breath sounds normal. No respiratory distress. He has no wheezes. He has no rales.  Musculoskeletal: He exhibits no edema.  Lymphadenopathy:    He has no cervical adenopathy.  Skin:  No foot lesions  Psychiatric: He has a normal mood and affect. His behavior is normal.          Assessment & Plan:

## 2016-01-04 NOTE — Progress Notes (Signed)
Pre visit review using our clinic review tool, if applicable. No additional management support is needed unless otherwise documented below in the visit note. 

## 2016-01-04 NOTE — Assessment & Plan Note (Signed)
Still seems to have good control Will check labs He is getting eye exam

## 2016-02-19 ENCOUNTER — Other Ambulatory Visit: Payer: Self-pay

## 2016-02-19 MED ORDER — LISINOPRIL-HYDROCHLOROTHIAZIDE 20-25 MG PO TABS: 1.0000 | ORAL_TABLET | Freq: Every day | ORAL | Status: DC

## 2016-02-19 MED ORDER — METFORMIN HCL ER 500 MG PO TB24: 500.0000 mg | ORAL_TABLET | Freq: Every day | ORAL | Status: DC

## 2016-02-19 MED ORDER — AMLODIPINE BESYLATE 10 MG PO TABS: 10.0000 mg | ORAL_TABLET | Freq: Every day | ORAL | Status: DC

## 2016-02-19 MED ORDER — PRAVASTATIN SODIUM 20 MG PO TABS: 20.0000 mg | ORAL_TABLET | Freq: Every day | ORAL | Status: DC

## 2016-02-19 NOTE — Telephone Encounter (Signed)
Rxs sent electronically. Cancelled the ones sent to [facility]

## 2016-02-20 ENCOUNTER — Ambulatory Visit (AMBULATORY_SURGERY_CENTER): Payer: Self-pay

## 2016-02-20 VITALS — Ht 75.5 in | Wt 233.4 lb

## 2016-02-20 DIAGNOSIS — Z1211 Encounter for screening for malignant neoplasm of colon: Secondary | ICD-10-CM

## 2016-02-20 NOTE — Progress Notes (Signed)
No allergies to eggs or soy No past problems with anesthesia No home oxygen No diet meds  Has email and internet; registered for emmi 

## 2016-03-06 ENCOUNTER — Ambulatory Visit (AMBULATORY_SURGERY_CENTER): Payer: Commercial Managed Care - PPO | Admitting: Gastroenterology

## 2016-03-06 ENCOUNTER — Encounter: Payer: Self-pay | Admitting: Gastroenterology

## 2016-03-06 VITALS — BP 119/73 | HR 52 | Temp 96.6°F | Resp 12 | Ht 75.0 in | Wt 233.0 lb

## 2016-03-06 DIAGNOSIS — D128 Benign neoplasm of rectum: Secondary | ICD-10-CM

## 2016-03-06 DIAGNOSIS — D122 Benign neoplasm of ascending colon: Secondary | ICD-10-CM | POA: Diagnosis not present

## 2016-03-06 DIAGNOSIS — D125 Benign neoplasm of sigmoid colon: Secondary | ICD-10-CM | POA: Diagnosis not present

## 2016-03-06 DIAGNOSIS — D120 Benign neoplasm of cecum: Secondary | ICD-10-CM | POA: Diagnosis not present

## 2016-03-06 DIAGNOSIS — D127 Benign neoplasm of rectosigmoid junction: Secondary | ICD-10-CM

## 2016-03-06 DIAGNOSIS — Z1211 Encounter for screening for malignant neoplasm of colon: Secondary | ICD-10-CM | POA: Diagnosis not present

## 2016-03-06 DIAGNOSIS — D129 Benign neoplasm of anus and anal canal: Secondary | ICD-10-CM

## 2016-03-06 LAB — GLUCOSE, CAPILLARY
Glucose-Capillary: 127 mg/dL — ABNORMAL HIGH (ref 65–99)
Glucose-Capillary: 123 mg/dL — ABNORMAL HIGH (ref 65–99)

## 2016-03-06 MED ORDER — SODIUM CHLORIDE 0.9 % IV SOLN: 500.0000 mL | INTRAVENOUS | Status: DC

## 2016-03-06 NOTE — Progress Notes (Signed)
Called to room to assist during endoscopic procedure.  Patient ID and intended procedure confirmed with present staff. Received instructions for my participation in the procedure from the performing physician.  

## 2016-03-06 NOTE — Progress Notes (Signed)
Expelled moderate amount of air. Patient rotated from side to side while in bed.

## 2016-03-06 NOTE — Progress Notes (Signed)
A and O x3. Report to RN. Tolerated MAC anesthesia well. 

## 2016-03-06 NOTE — Op Note (Addendum)
[place] Patient Name: Hunter Richmond Procedure Date: 03/06/2016 10:03 AM MRN: [id number] Endoscopist: Remo Lipps P. Havery Moros , MD Age: 55 Referring MD:  Date of Birth: 1961-07-04 Gender: Male Account #: [id number] Procedure:                Colonoscopy Indications:              Screening for malignant neoplasm in the colon, This                            is the patient's first colonoscopy Medicines:                Monitored Anesthesia Care Procedure:                Pre-Anesthesia Assessment:                           - Prior to the procedure, a History and Physical                            was performed, and patient medications and                            allergies were reviewed. The patient's tolerance of                            previous anesthesia was also reviewed. The risks                            and benefits of the procedure and the sedation                            options and risks were discussed with the patient.                            All questions were answered, and informed consent                            was obtained. Prior Anticoagulants: The patient has                            taken no previous anticoagulant or antiplatelet                            agents. ASA Grade Assessment: II - A patient with                            mild systemic disease. After reviewing the risks                            and benefits, the patient was deemed in                            satisfactory condition to undergo the procedure.  After obtaining informed consent, the colonoscope                            was passed under direct vision. Throughout the                            procedure, the patient's blood pressure, pulse, and                            oxygen saturations were monitored continuously. The                            Model CF-HQ190L [id number]) scope was introduced                            through the anus and  advanced to the the cecum,                            identified by appendiceal orifice and ileocecal                            valve. The colonoscopy was technically difficult                            and complex due to significant looping. The patient                            tolerated the procedure well. The quality of the                            bowel preparation was adequate. The ileocecal                            valve, appendiceal orifice, and rectum were                            photographed. Scope In: 10:13:05 AM Scope Out: 10:49:29 AM Scope Withdrawal Time: 0 hours 27 minutes 36 seconds  Total Procedure Duration: 0 hours 36 minutes 24 seconds  Findings:                 The perianal and digital rectal examinations were                            normal.                           Two sessile polyps were found in the cecum. The                            polyps were 3 to 5 mm in size. These polyps were                            removed with a cold snare. Resection and retrieval  were complete.                           Two sessile polyps were found in the ascending                            colon. The polyps were 3 to 5 mm in size. These                            polyps were removed with a cold snare. Resection                            and retrieval were complete.                           Two sessile polyps were found in the ascending                            colon. The polyps were 3 mm in size. These polyps                            were removed with a cold biopsy forceps. Resection                            and retrieval were complete.                           A 5 mm polyp was found in the sigmoid colon. The                            polyp was sessile. The polyp was removed with a                            cold snare. Resection and retrieval were complete.                           A 8 mm polyp was found in the recto-sigmoid  colon.                            The polyp was pedunculated. The polyp was removed                            with a hot snare. Resection and retrieval were                            complete.                           A 6 mm polyp was found in the rectum. The polyp was                            semi-pedunculated. The polyp was removed with a hot  snare. Resection and retrieval were complete.                           A 3 mm polyp was found in the rectum. The polyp was                            sessile. The polyp was removed with a cold biopsy                            forceps. Resection and retrieval were complete.                           The ascending colon revealed excessive looping.                           The exam was otherwise without abnormality on                            direct and retroflexion views. Complications:            No immediate complications. Estimated blood loss:                            Minimal. Estimated Blood Loss:     Estimated blood loss was minimal. Impression:               - Two 3 to 5 mm polyps in the cecum, removed with a                            cold snare. Resected and retrieved.                           - Two 3 to 5 mm polyps in the ascending colon,                            removed with a cold snare. Resected and retrieved.                           - Two 3 mm polyps in the ascending colon, removed                            with a cold biopsy forceps. Resected and retrieved.                           - One 5 mm polyp in the sigmoid colon, removed with                            a cold snare. Resected and retrieved.                           - One 8 mm polyp at the recto-sigmoid colon,  removed with a hot snare. Resected and retrieved.                           - One 6 mm polyp in the rectum, removed with a hot                            snare. Resected and retrieved.                            - One 3 mm polyp in the rectum, removed with a cold                            biopsy forceps. Resected and retrieved.                           - There was significant looping of the colon.                           - The examination was otherwise normal on direct                            and retroflexion views. Recommendation:           - Patient has a contact number available for                            emergencies. The signs and symptoms of potential                            delayed complications were discussed with the                            patient. Return to normal activities tomorrow.                            Written discharge instructions were provided to the                            patient.                           - Resume previous diet.                           - Continue present medications.                           - No aspirin, ibuprofen, naproxen, or other                            non-steroidal anti-inflammatory drugs for 2 weeks                            after polyp removal.                           -  Await pathology results.                           - Repeat colonoscopy is recommended for                            surveillance. The colonoscopy date will be                            determined after pathology results from today's                            exam become available for review. Remo Lipps P. Havery Moros, MD 03/06/2016 10:56:49 AM This report has been signed electronically.

## 2016-03-06 NOTE — Patient Instructions (Signed)
Discharge instructions given. Handout on polyps. Hold aspirin and any thing containing aspirin for the next 2 weeks. Resume previous medications. YOU HAD AN ENDOSCOPIC PROCEDURE TODAY AT [facility]:   Refer to the procedure report that was given to you for any specific questions about what was found during the examination.  If the procedure report does not answer your questions, please call your gastroenterologist to clarify.  If you requested that your care partner not be given the details of your procedure findings, then the procedure report has been included in a sealed envelope for you to review at your convenience later.  YOU SHOULD EXPECT: Some feelings of bloating in the abdomen. Passage of more gas than usual.  Walking can help get rid of the air that was put into your GI tract during the procedure and reduce the bloating. If you had a lower endoscopy (such as a colonoscopy or flexible sigmoidoscopy) you may notice spotting of blood in your stool or on the toilet paper. If you underwent a bowel prep for your procedure, you may not have a normal bowel movement for a few days.  Please Note:  You might notice some irritation and congestion in your nose or some drainage.  This is from the oxygen used during your procedure.  There is no need for concern and it should clear up in a day or so.  SYMPTOMS TO REPORT IMMEDIATELY:   Following lower endoscopy (colonoscopy or flexible sigmoidoscopy):  Excessive amounts of blood in the stool  Significant tenderness or worsening of abdominal pains  Swelling of the abdomen that is new, acute  Fever of 100F or higher   For urgent or emergent issues, a gastroenterologist can be reached at any hour by calling [phone].   DIET: Your first meal following the procedure should be a small meal and then it is ok to progress to your normal diet. Heavy or fried foods are harder to digest and may make you feel nauseous or bloated.   Likewise, meals heavy in dairy and vegetables can increase bloating.  Drink plenty of fluids but you should avoid alcoholic beverages for 24 hours.  ACTIVITY:  You should plan to take it easy for the rest of today and you should NOT DRIVE or use heavy machinery until tomorrow (because of the sedation medicines used during the test).    FOLLOW UP: Our staff will call the number listed on your records the next business day following your procedure to check on you and address any questions or concerns that you may have regarding the information given to you following your procedure. If we do not reach you, we will leave a message.  However, if you are feeling well and you are not experiencing any problems, there is no need to return our call.  We will assume that you have returned to your regular daily activities without incident.  If any biopsies were taken you will be contacted by phone or by letter within the next 1-3 weeks.  Please call us at [phone] if you have not heard about the biopsies in 3 weeks.    SIGNATURES/CONFIDENTIALITY: You and/or your care partner have signed paperwork which will be entered into your electronic medical record.  These signatures attest to the fact that that the information above on your After Visit Summary has been reviewed and is understood.  Full responsibility of the confidentiality of this discharge information lies with you and/or your care-partner.

## 2016-03-07 ENCOUNTER — Telehealth: Payer: Self-pay | Admitting: *Deleted

## 2016-03-07 NOTE — Telephone Encounter (Signed)
  Follow up Call-  Call back number 03/06/2016  Post procedure Call Back phone  # [phone]  Permission to leave phone message Yes     Patient questions:  Do you have a fever, pain , or abdominal swelling? No. Pain Score  0 *  Have you tolerated food without any problems? Yes.    Have you been able to return to your normal activities? Yes.    Do you have any questions about your discharge instructions: Diet   No. Medications  No. Follow up visit  No.  Do you have questions or concerns about your Care? No.  Actions: * If pain score is 4 or above: No action needed, pain <4.

## 2016-03-13 ENCOUNTER — Encounter: Payer: Self-pay | Admitting: Gastroenterology

## 2016-07-22 ENCOUNTER — Encounter: Payer: Self-pay | Admitting: Internal Medicine

## 2016-07-22 ENCOUNTER — Ambulatory Visit (INDEPENDENT_AMBULATORY_CARE_PROVIDER_SITE_OTHER): Payer: Commercial Managed Care - PPO | Admitting: Internal Medicine

## 2016-07-22 VITALS — BP 122/80 | HR 66 | Temp 98.0°F | Ht 74.5 in | Wt 230.0 lb

## 2016-07-22 DIAGNOSIS — E785 Hyperlipidemia, unspecified: Secondary | ICD-10-CM | POA: Diagnosis not present

## 2016-07-22 DIAGNOSIS — Z0000 Encounter for general adult medical examination without abnormal findings: Secondary | ICD-10-CM | POA: Diagnosis not present

## 2016-07-22 DIAGNOSIS — I10 Essential (primary) hypertension: Secondary | ICD-10-CM | POA: Diagnosis not present

## 2016-07-22 DIAGNOSIS — Z23 Encounter for immunization: Secondary | ICD-10-CM

## 2016-07-22 DIAGNOSIS — E119 Type 2 diabetes mellitus without complications: Secondary | ICD-10-CM | POA: Diagnosis not present

## 2016-07-22 DIAGNOSIS — Z125 Encounter for screening for malignant neoplasm of prostate: Secondary | ICD-10-CM | POA: Diagnosis not present

## 2016-07-22 LAB — CBC WITH DIFFERENTIAL/PLATELET
BASOS ABS: 0 10*3/uL (ref 0.0–0.1)
Basophils Relative: 0.3 % (ref 0.0–3.0)
EOS PCT: 1.2 % (ref 0.0–5.0)
Eosinophils Absolute: 0.1 10*3/uL (ref 0.0–0.7)
HEMATOCRIT: 47.2 % (ref 39.0–52.0)
HEMOGLOBIN: 15.9 g/dL (ref 13.0–17.0)
LYMPHS PCT: 41.1 % (ref 12.0–46.0)
Lymphs Abs: 2.5 10*3/uL (ref 0.7–4.0)
MCHC: 33.8 g/dL (ref 30.0–36.0)
MCV: 93.5 fl (ref 78.0–100.0)
MONOS PCT: 8.4 % (ref 3.0–12.0)
Monocytes Absolute: 0.5 10*3/uL (ref 0.1–1.0)
Neutro Abs: 3 10*3/uL (ref 1.4–7.7)
Neutrophils Relative %: 49 % (ref 43.0–77.0)
Platelets: 278 10*3/uL (ref 150.0–400.0)
RBC: 5.05 Mil/uL (ref 4.22–5.81)
RDW: 12.9 % (ref 11.5–15.5)
WBC: 6 10*3/uL (ref 4.0–10.5)

## 2016-07-22 LAB — COMPREHENSIVE METABOLIC PANEL
ALBUMIN: 4.7 g/dL (ref 3.5–5.2)
ALK PHOS: 66 U/L (ref 39–117)
ALT: 16 U/L (ref 0–53)
AST: 14 U/L (ref 0–37)
BILIRUBIN TOTAL: 0.6 mg/dL (ref 0.2–1.2)
BUN: 11 mg/dL (ref 6–23)
CALCIUM: 10.4 mg/dL (ref 8.4–10.5)
CO2: 32 mEq/L (ref 19–32)
Chloride: 99 mEq/L (ref 96–112)
Creatinine, Ser: 0.9 mg/dL (ref 0.40–1.50)
GFR: 112.79 mL/min (ref >60.00)
GLUCOSE: 143 mg/dL — AB (ref 70–99)
POTASSIUM: 4 meq/L (ref 3.5–5.1)
Sodium: 138 mEq/L (ref 135–145)
TOTAL PROTEIN: 7.2 g/dL (ref 6.0–8.3)

## 2016-07-22 LAB — LIPID PANEL
CHOLESTEROL: 159 mg/dL (ref 0–200)
HDL: 39.3 mg/dL (ref >39.00)
LDL CALC: 81 mg/dL (ref 0–99)
NonHDL: 120.12
Total CHOL/HDL Ratio: 4
Triglycerides: 197 mg/dL — ABNORMAL HIGH (ref 0.0–149.0)
VLDL: 39.4 mg/dL (ref 0.0–40.0)

## 2016-07-22 LAB — HM DIABETES FOOT EXAM

## 2016-07-22 LAB — PSA: PSA: 0.87 ng/mL (ref 0.10–4.00)

## 2016-07-22 LAB — HEMOGLOBIN A1C: HEMOGLOBIN A1C: 6.5 % (ref 4.6–6.5)

## 2016-07-22 NOTE — Assessment & Plan Note (Signed)
BP Readings from Last 3 Encounters:  07/22/16 122/80  03/06/16 119/73  01/04/16 108/80   Good control

## 2016-07-22 NOTE — Assessment & Plan Note (Signed)
No problems with statin 

## 2016-07-22 NOTE — Assessment & Plan Note (Signed)
Doing well PSA after discussion Colon due 3 years prevnar and flu vaccines

## 2016-07-22 NOTE — Assessment & Plan Note (Signed)
Seems to still have good control Really is going to eye doctor soon

## 2016-07-22 NOTE — Progress Notes (Signed)
Pre visit review using our clinic review tool, if applicable. No additional management support is needed unless otherwise documented below in the visit note. 

## 2016-07-22 NOTE — Progress Notes (Signed)
Subjective:    Patient ID: Hunter Richmond, male    DOB: Dec 16, 1960, 55 y.o.   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 amLODipine (NORVASC) 10 MG tablet Take 1 tablet (10 mg total) by mouth daily. 90 tablet 1  . glucose blood (ONE TOUCH ULTRA TEST) test strip Use to test blood sugar once daily dx: E11.49 100 each 3  . lisinopril-hydrochlorothiazide (PRINZIDE,ZESTORETIC) 20-25 MG tablet Take 1 tablet by mouth daily. 90 tablet 1  . metFORMIN (GLUCOPHAGE-XR) 500 MG 24 hr tablet Take 1-2 tablets (500-1,000 mg total) by mouth daily with breakfast. 180 tablet 1  . pravastatin (PRAVACHOL) 20 MG tablet Take 1 tablet (20 mg total) by mouth daily. 90 tablet 1   No current facility-administered medications on file prior to visit.     No Known Allergies  Past Medical History:  Diagnosis Date  . Diabetes mellitus   . GERD (gastroesophageal reflux disease)   . Hyperlipidemia   . Hypertension     Past Surgical History:  Procedure Laterality Date  . FINGER SURGERY  1979   torn ligament  . WISDOM TOOTH EXTRACTION  1989    Family History  Problem Relation Age of Onset  . Diabetes Brother   . Cancer Maternal Aunt     breast cancer  . Cancer Maternal Uncle     throat  . Heart disease Neg Hx   . Colon cancer Neg Hx   . Esophageal cancer Neg Hx   . Rectal cancer Neg Hx   . Stomach cancer Neg Hx   . Diabetes Sister     Social History   Social History  . Marital status: Married    Spouse name: N/A  . Number of children: 1  . Years of education: N/A   Occupational History  . Truck driver     short and occasionally overnight    .     [employer]      Social History Main Topics  . Smoking status: Never Smoker  . Smokeless tobacco: Never Used  . Alcohol use No  . Drug use: No  . Sexual activity: Not on file   Other Topics Concern  . Not on file   Social History Narrative   [employer]-- vegetarian                     Review of Systems  Constitutional: Negative for fatigue and unexpected weight change.       Wears seat belt Some weight training--- feels "firmer"  HENT: Negative for dental problem, hearing loss, tinnitus and trouble swallowing.        Keeps up with dentist  Eyes: Negative for visual disturbance.       No diplopia or unilateral vision loss  Respiratory: Negative for cough, chest tightness and shortness of breath.   Cardiovascular: Negative for chest pain, palpitations and leg swelling.  Gastrointestinal: Negative for abdominal pain, blood in stool, constipation, nausea and vomiting.       No heartburn  Endocrine: Negative for polydipsia and polyuria.  Genitourinary: Negative for difficulty urinating and urgency.  No sexual problems  Musculoskeletal: Negative for arthralgias, back pain and joint swelling.  Skin: Negative for rash.       No suspicious lesions  Allergic/Immunologic: Negative for environmental allergies and immunocompromised state.  Neurological: Negative for dizziness, syncope, weakness, light-headedness and headaches.  Hematological: Negative for adenopathy. Does not bruise/bleed easily.  Psychiatric/Behavioral: Negative for sleep disturbance.       Objective:   Physical Exam  Constitutional: He is oriented to person, place, and time. He appears well-developed and well-nourished. No distress.  HENT:  Head: Normocephalic and atraumatic.  Right Ear: External ear normal.  Left Ear: External ear normal.  Mouth/Throat: Oropharynx is clear and moist. No oropharyngeal exudate.  Eyes: Conjunctivae are normal. Pupils are equal, round, and reactive to light.   Neck: Normal range of motion. Neck supple. No thyromegaly present.  Cardiovascular: Normal rate, regular rhythm, normal heart sounds and intact distal pulses.  Exam reveals no gallop.   No murmur heard. Pulmonary/Chest: Effort normal and breath sounds normal. No respiratory distress. He has no wheezes. He has no rales.  Abdominal: Soft. There is no tenderness.  Musculoskeletal: He exhibits no edema or tenderness.  Lymphadenopathy:    He has no cervical adenopathy.  Neurological: He is alert and oriented to person, place, and time.  Normal sensation in feet  Skin: No rash noted. No erythema.  No foot lesions  Psychiatric: He has a normal mood and affect. His behavior is normal.          Assessment & Plan:

## 2016-07-22 NOTE — Addendum Note (Signed)
Addended by: Pilar Grammes on: 07/22/2016 12:32 PM   Modules accepted: Orders

## 2016-08-01 ENCOUNTER — Other Ambulatory Visit: Payer: Self-pay | Admitting: Internal Medicine

## 2016-10-16 ENCOUNTER — Other Ambulatory Visit: Payer: Self-pay | Admitting: Internal Medicine

## 2016-11-12 LAB — HM DIABETES EYE EXAM

## 2016-11-24 ENCOUNTER — Encounter: Payer: Self-pay | Admitting: Internal Medicine

## 2017-01-08 ENCOUNTER — Other Ambulatory Visit: Payer: Self-pay | Admitting: Internal Medicine

## 2017-01-08 NOTE — Telephone Encounter (Signed)
Pt request status of metformin refill; I called [facility] [address] and was advised that refill was received but cannot get refill until 03/13/17 due to pt getting med from mail order. Pt said he is going out of town and needs med until m.o comes next week. Pt will contacat [facility] [address] to get few pills while out of town.

## 2017-01-19 ENCOUNTER — Ambulatory Visit (INDEPENDENT_AMBULATORY_CARE_PROVIDER_SITE_OTHER): Payer: Commercial Managed Care - PPO | Admitting: Internal Medicine

## 2017-01-19 ENCOUNTER — Encounter: Payer: Self-pay | Admitting: Internal Medicine

## 2017-01-19 VITALS — BP 120/76 | HR 66 | Temp 98.1°F | Wt 231.5 lb

## 2017-01-19 DIAGNOSIS — E119 Type 2 diabetes mellitus without complications: Secondary | ICD-10-CM

## 2017-01-19 LAB — HEMOGLOBIN A1C: Hgb A1c MFr Bld: 6.9 % — ABNORMAL HIGH (ref 4.6–6.5)

## 2017-01-19 NOTE — Patient Instructions (Signed)
Peyronnie's disease

## 2017-01-19 NOTE — Progress Notes (Signed)
Pre visit review using our clinic review tool, if applicable. No additional management support is needed unless otherwise documented below in the visit note. 

## 2017-01-19 NOTE — Assessment & Plan Note (Signed)
Still seems to have excellent control Discussed getting back to his exercise Check A1c

## 2017-01-19 NOTE — Progress Notes (Signed)
   Subjective:    Patient ID: Hunter Richmond, male    DOB: 02-07-1961, 56 y.o.   MRN: [id number]  HPI Here for follow up of diabetes  He feels well Checks sugars every other day or so Usually around 100 No hypoglycemic reactions No sores, numbness or pain in feet  Current Outpatient Prescriptions on File Prior to Visit  Medication Sig Dispense Refill  . amLODipine (NORVASC) 10 MG tablet TAKE 1 TABLET DAILY 90 tablet 3  . glucose blood (ONE TOUCH ULTRA TEST) test strip Use to test blood sugar once daily dx: E11.49 100 each 3  . lisinopril-hydrochlorothiazide (PRINZIDE,ZESTORETIC) 20-25 MG tablet TAKE 1 TABLET DAILY 90 tablet 3  . metFORMIN (GLUCOPHAGE-XR) 500 MG 24 hr tablet TAKE 1 TO 2 TABLETS DAILY WITH BREAKFAST 180 tablet 3  . pravastatin (PRAVACHOL) 20 MG tablet TAKE 1 TABLET DAILY 90 tablet 3   No current facility-administered medications on file prior to visit.     No Known Allergies  Past Medical History:  Diagnosis Date  . Diabetes mellitus   . GERD (gastroesophageal reflux disease)   . Hyperlipidemia   . Hypertension     Past Surgical History:  Procedure Laterality Date  . FINGER SURGERY  1979   torn ligament  . WISDOM TOOTH EXTRACTION  1989    Family History  Problem Relation Age of Onset  . Diabetes Brother   . Cancer Maternal Aunt     breast cancer  . Cancer Maternal Uncle     throat  . Heart disease Neg Hx   . Colon cancer Neg Hx   . Esophageal cancer Neg Hx   . Rectal cancer Neg Hx   . Stomach cancer Neg Hx   . Diabetes Sister     Social History   Social History  . Marital status: Married    Spouse name: N/A  . Number of children: 1  . Years of education: N/A   Occupational History  . Truck driver     short and occasionally overnight  .     [employer]      Social History Main Topics  . Smoking status: Never Smoker  . Smokeless tobacco: Never Used  . Alcohol use No  . Drug use: No  . Sexual activity: Not on file   Other Topics Concern  .  Not on file   Social History Narrative   [employer]-- vegetarian                     Review of Systems  Sleeps well Appetite is good Weight is stable Exercise down a bit--trying to get back regular on this Chronic bend in his penis---wife mentioned it recently. No pain and able to finish intercourse without difficulty (discussed---will hold off on any Rx)     Objective:   Physical Exam  Constitutional: He appears well-nourished. No distress.  Neck: No thyromegaly present.  Cardiovascular: Normal rate, regular rhythm, normal heart sounds and intact distal pulses.  Exam reveals no gallop.   No murmur heard. Pulmonary/Chest: Effort normal and breath sounds normal. No respiratory distress. He has no wheezes. He has no rales.  Abdominal: Soft. There is no tenderness.  Musculoskeletal: He exhibits no edema or tenderness.  Lymphadenopathy:    He has no cervical adenopathy.  Psychiatric: He has a normal mood and affect. His behavior is normal.          Assessment & Plan:

## 2017-02-02 ENCOUNTER — Encounter: Payer: Self-pay | Admitting: Family Medicine

## 2017-02-02 ENCOUNTER — Ambulatory Visit (INDEPENDENT_AMBULATORY_CARE_PROVIDER_SITE_OTHER): Payer: Commercial Managed Care - PPO | Admitting: Family Medicine

## 2017-02-02 VITALS — BP 126/80 | HR 74 | Temp 98.3°F | Ht 74.5 in | Wt 234.0 lb

## 2017-02-02 DIAGNOSIS — R609 Edema, unspecified: Secondary | ICD-10-CM | POA: Diagnosis not present

## 2017-02-02 LAB — BASIC METABOLIC PANEL
BUN: 7 mg/dL (ref 6–23)
CHLORIDE: 99 meq/L (ref 96–112)
CO2: 30 mEq/L (ref 19–32)
Calcium: 10.1 mg/dL (ref 8.4–10.5)
Creatinine, Ser: 0.85 mg/dL (ref 0.40–1.50)
GFR: 120.24 mL/min (ref >60.00)
Glucose, Bld: 130 mg/dL — ABNORMAL HIGH (ref 70–99)
POTASSIUM: 3.4 meq/L — AB (ref 3.5–5.1)
SODIUM: 136 meq/L (ref 135–145)

## 2017-02-02 NOTE — Assessment & Plan Note (Signed)
Likely combination of longer drive route, amlodipine, possibly higher salt intake.  Check BMET today.  Avoid salt.  If more edema update [place].  Okay to cut CCB down to 5mg  if sx return.  D/w pt.  He agrees.  He can monitor BP at home.  Okay for outpatient f/u, esp since sx improved today.

## 2017-02-02 NOTE — Patient Instructions (Signed)
Go to the lab on the way out.  We'll contact you with your lab report. If you have more swelling, then cut the amlodipine to 5mg  a day and check your BP and update [place] about both (swelling and BP).  Take care.  Glad to see you.

## 2017-02-02 NOTE — Progress Notes (Signed)
Sx started about 5 days ago.  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  Noted B BLE edema, worse than normal.   When he would lay down and rest the fluid would get better.  No exertional CP, SOB.  Can walk about 2-4 miles w/o troubles.  No vomiting, diarrhea, fevers.  On CCB.  Swelling is better now, clearly back to baseline per patient.  No lip or tongue swelling.  Weight is stable.    He has a BP cuff at home.  He may have extra routes in the future that are long, but he usually doesn't do so long a route.    Meds, vitals, and allergies reviewed.   ROS: Per HPI unless specifically indicated in ROS section   GEN: nad, alert and oriented HEENT: mucous membranes moist NECK: supple w/o LA CV: rrr.  no murmur PULM: ctab, no inc wob ABD: soft, +bs EXT: no edema SKIN: no acute rash

## 2017-03-05 ENCOUNTER — Ambulatory Visit: Payer: Commercial Managed Care - PPO | Admitting: Internal Medicine

## 2017-06-12 ENCOUNTER — Other Ambulatory Visit: Payer: Self-pay | Admitting: *Deleted

## 2017-06-12 MED ORDER — METFORMIN HCL ER 500 MG PO TB24: ORAL_TABLET | ORAL | 0 refills | Status: DC

## 2017-06-12 NOTE — Telephone Encounter (Signed)
Pt changed from mail order to local pharmacy

## 2017-07-04 ENCOUNTER — Other Ambulatory Visit: Payer: Self-pay | Admitting: Internal Medicine

## 2017-07-09 ENCOUNTER — Other Ambulatory Visit: Payer: Self-pay | Admitting: Internal Medicine

## 2017-07-20 ENCOUNTER — Ambulatory Visit: Payer: Commercial Managed Care - PPO | Admitting: Internal Medicine

## 2017-07-21 ENCOUNTER — Encounter: Payer: Commercial Managed Care - PPO | Admitting: Internal Medicine

## 2017-07-27 ENCOUNTER — Encounter: Payer: Commercial Managed Care - PPO | Admitting: Internal Medicine

## 2017-08-24 ENCOUNTER — Other Ambulatory Visit: Payer: Self-pay | Admitting: Internal Medicine

## 2017-09-23 ENCOUNTER — Encounter: Payer: Self-pay | Admitting: Internal Medicine

## 2017-09-23 ENCOUNTER — Ambulatory Visit: Payer: Managed Care, Other (non HMO) | Admitting: Internal Medicine

## 2017-09-23 VITALS — BP 130/90 | HR 58 | Temp 98.1°F | Wt 227.8 lb

## 2017-09-23 DIAGNOSIS — E785 Hyperlipidemia, unspecified: Secondary | ICD-10-CM | POA: Diagnosis not present

## 2017-09-23 DIAGNOSIS — S46912A Strain of unspecified muscle, fascia and tendon at shoulder and upper arm level, left arm, initial encounter: Secondary | ICD-10-CM | POA: Insufficient documentation

## 2017-09-23 DIAGNOSIS — I10 Essential (primary) hypertension: Secondary | ICD-10-CM

## 2017-09-23 DIAGNOSIS — E119 Type 2 diabetes mellitus without complications: Secondary | ICD-10-CM

## 2017-09-23 LAB — COMPREHENSIVE METABOLIC PANEL
ALBUMIN: 4.8 g/dL (ref 3.5–5.2)
ALT: 15 U/L (ref 0–53)
AST: 14 U/L (ref 0–37)
Alkaline Phosphatase: 67 U/L (ref 39–117)
BILIRUBIN TOTAL: 0.9 mg/dL (ref 0.2–1.2)
BUN: 8 mg/dL (ref 6–23)
CALCIUM: 10.2 mg/dL (ref 8.4–10.5)
CO2: 33 mEq/L — ABNORMAL HIGH (ref 19–32)
CREATININE: 0.82 mg/dL (ref 0.40–1.50)
Chloride: 99 mEq/L (ref 96–112)
GFR: 125.04 mL/min (ref >60.00)
Glucose, Bld: 139 mg/dL — ABNORMAL HIGH (ref 70–99)
Potassium: 3.6 mEq/L (ref 3.5–5.1)
Sodium: 140 mEq/L (ref 135–145)
TOTAL PROTEIN: 7.5 g/dL (ref 6.0–8.3)

## 2017-09-23 LAB — CBC
HCT: 49.4 % (ref 39.0–52.0)
Hemoglobin: 16.4 g/dL (ref 13.0–17.0)
MCHC: 33.1 g/dL (ref 30.0–36.0)
MCV: 94.9 fl (ref 78.0–100.0)
Platelets: 288 10*3/uL (ref 150.0–400.0)
RBC: 5.21 Mil/uL (ref 4.22–5.81)
RDW: 12.6 % (ref 11.5–15.5)
WBC: 5.4 10*3/uL (ref 4.0–10.5)

## 2017-09-23 LAB — HM DIABETES FOOT EXAM

## 2017-09-23 LAB — LIPID PANEL
CHOL/HDL RATIO: 4
CHOLESTEROL: 144 mg/dL (ref 0–200)
HDL: 40 mg/dL (ref >39.00)
LDL Cholesterol: 85 mg/dL (ref 0–99)
NonHDL: 103.97
TRIGLYCERIDES: 97 mg/dL (ref 0.0–149.0)
VLDL: 19.4 mg/dL (ref 0.0–40.0)

## 2017-09-23 LAB — HEMOGLOBIN A1C: Hgb A1c MFr Bld: 6.6 % — ABNORMAL HIGH (ref 4.6–6.5)

## 2017-09-23 NOTE — Assessment & Plan Note (Signed)
Still seems to have good control Will check A1c 

## 2017-09-23 NOTE — Assessment & Plan Note (Signed)
Acceptable control BP Readings from Last 3 Encounters:  09/23/17 130/90  02/02/17 126/80  01/19/17 120/76

## 2017-09-23 NOTE — Progress Notes (Signed)
Subjective:    Patient ID: Hunter Richmond, male    DOB: 01/20/1961, 57 y.o.   MRN: [id number]  HPI Here for follow up of diabetes and other chronic health conditions  Has been having some shoulder pain on left Some past pain--doesn't remember an acute injury but did have "a slight pull back" a couple of months ago Mostly okay--but occasional discomfort---over top of scapula and lateral shoulder No restriction in movement No Rx Has cut back on shoulder presses with his weight work (does it while on treadmill)  Checking sugars twice a week Usually 90-110 fasting No low sugar reactions No foot numbness or pain  Worked all night last night--no sleep and had caffeine This is why BP up--always good when he checks No chest pain  No SOB Exercise tolerance is fine No edema  Current Outpatient Medications on File Prior to Visit  Medication Sig Dispense Refill  . amLODipine (NORVASC) 10 MG tablet TAKE 1 TABLET DAILY 90 tablet 0  . glucose blood (ONE TOUCH ULTRA TEST) test strip USE TO TEST BLOOD SUGAR ONCE DAILY 100 each 3  . lisinopril-hydrochlorothiazide (PRINZIDE,ZESTORETIC) 20-25 MG tablet TAKE ONE TABLET BY MOUTH ONCE DAILY 90 tablet 0  . metFORMIN (GLUCOPHAGE-XR) 500 MG 24 hr tablet TAKE 1 TO 2 TABLETS BY MOUTH ONCE DAILY WITH BREAKFAST 180 tablet 0  . pravastatin (PRAVACHOL) 20 MG tablet TAKE 1 TABLET BY MOUTH ONCE DAILY 90 tablet 0   No current facility-administered medications on file prior to visit.     No Known Allergies  Past Medical History:  Diagnosis Date  . Diabetes mellitus   . GERD (gastroesophageal reflux disease)   . Hyperlipidemia   . Hypertension     Past Surgical History:  Procedure Laterality Date  . FINGER SURGERY  1979   torn ligament  . WISDOM TOOTH EXTRACTION  1989    Family History  Problem Relation Age of Onset  . Diabetes Brother   . Cancer Maternal Aunt        breast cancer  . Cancer Maternal Uncle        throat  . Diabetes Sister     . Heart disease Neg Hx   . Colon cancer Neg Hx   . Esophageal cancer Neg Hx   . Rectal cancer Neg Hx   . Stomach cancer Neg Hx     Social History   Socioeconomic History  . Marital status: Married    Spouse name: Not on file  . Number of children: 1  . Years of education: Not on file  . Highest education level: Not on file  Social Needs  . Financial resource strain: Not on file  . Food insecurity - worry: Not on file  . Food insecurity - inability: Not on file  . Transportation needs - medical: Not on file  . Transportation needs - non-medical: Not on file  Occupational History  . 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 Occupation:    . Occupation:    Tobacco Use  . Smoking status: Never Smoker  . Smokeless tobacco: Never Used  Substance and Sexual Activity  . Alcohol use: No  . Drug use: No  . Sexual activity: Not on file  Other Topics Concern  . Not on file  Social History Narrative   [employer]-- vegetarian   Review of Systems Rare headaches on right side Sleeps well--uses advil pm on rare occasions Appetite is fine Weight is  stable    Objective:   Physical Exam  Constitutional: He appears well-developed. No distress.  Neck: No thyromegaly present.  Cardiovascular: Normal rate, regular rhythm, normal heart sounds and intact distal pulses. Exam reveals no gallop.  No murmur heard. Pulmonary/Chest: Effort normal and breath sounds normal. No respiratory distress. He has no wheezes. He has no rales.  Musculoskeletal: He exhibits no edema or tenderness.  No left shoulder tenderness Full active ROM   Lymphadenopathy:    He has no cervical adenopathy.  Neurological:  Normal sensation in feet  Skin: No rash noted. No erythema.  No foot lesions  Psychiatric: He has a normal mood and affect. His behavior is normal.          Assessment & Plan:

## 2017-09-23 NOTE — Assessment & Plan Note (Signed)
No problems with statin 

## 2017-09-23 NOTE — Assessment & Plan Note (Signed)
Minor changes and symptoms Reassured Can restart weights but at lower level

## 2017-12-01 ENCOUNTER — Other Ambulatory Visit: Payer: Self-pay | Admitting: Internal Medicine

## 2017-12-04 LAB — HM DIABETES EYE EXAM

## 2017-12-23 ENCOUNTER — Ambulatory Visit: Payer: Managed Care, Other (non HMO) | Admitting: Internal Medicine

## 2017-12-23 ENCOUNTER — Encounter: Payer: Self-pay | Admitting: Internal Medicine

## 2017-12-23 VITALS — BP 112/78 | HR 65 | Temp 98.5°F | Ht 75.0 in | Wt 228.0 lb

## 2017-12-23 DIAGNOSIS — R6889 Other general symptoms and signs: Secondary | ICD-10-CM

## 2017-12-23 DIAGNOSIS — G4726 Circadian rhythm sleep disorder, shift work type: Secondary | ICD-10-CM | POA: Diagnosis not present

## 2017-12-23 DIAGNOSIS — R208 Other disturbances of skin sensation: Secondary | ICD-10-CM | POA: Insufficient documentation

## 2017-12-23 NOTE — Assessment & Plan Note (Signed)
Discussed alternatives Diphenhydramine in low dose is probably the best option---safer than prescription benzos, etc Asked him to try without the ibuprofen

## 2017-12-23 NOTE — Patient Instructions (Signed)
Please try diphenhydramine 12.5-25mg  to help your sleep.

## 2017-12-23 NOTE — Progress Notes (Signed)
Subjective:    Patient ID: Hunter Richmond, male    DOB: [birth year], 57 y.o.   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 amLODipine (NORVASC) 10 MG tablet TAKE 1 TABLET BY MOUTH ONCE DAILY 90 tablet 3  . glucose blood (ONE TOUCH ULTRA TEST) test strip USE TO TEST BLOOD SUGAR ONCE DAILY 100 each 3  . lisinopril-hydrochlorothiazide (PRINZIDE,ZESTORETIC) 20-25 MG tablet TAKE 1 TABLET BY MOUTH ONCE DAILY 90 tablet 3  . metFORMIN (GLUCOPHAGE-XR) 500 MG 24 hr tablet TAKE 1 TO 2 TABLETS BY MOUTH ONCE DAILY WITH BREAKFAST 180 tablet 3  . pravastatin (PRAVACHOL) 20 MG tablet TAKE 1 TABLET BY MOUTH ONCE DAILY 90 tablet 0   No current facility-administered medications on file prior to visit.     No Known Allergies  Past Medical History:  Diagnosis Date  . Diabetes mellitus   . GERD (gastroesophageal reflux disease)   . Hyperlipidemia   . Hypertension     Past Surgical History:  Procedure Laterality Date  . FINGER SURGERY  1979   torn ligament  . WISDOM TOOTH EXTRACTION  1989    Family History  Problem Relation Age of Onset  . Diabetes Brother   . Cancer Maternal Aunt        breast cancer  . Cancer Maternal Uncle        throat  . Diabetes Sister   . Heart disease Neg Hx   . Colon cancer Neg Hx   . Esophageal cancer Neg Hx   . Rectal cancer Neg Hx   . Stomach cancer Neg Hx     Social History    Socioeconomic History  . Marital status: Married    Spouse name: Not on file  . Number of children: 1  . Years of education: Not on file  . Highest education level: Not on file  Occupational History  . 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 Occupation:    . Occupation:    Social Needs  . Financial resource strain: Not on file  . Food insecurity:    Worry: Not on file    Inability: Not on file  . Transportation needs:    Medical: Not on file    Non-medical: Not on file  Tobacco Use  . Smoking status: Never Smoker  . Smokeless tobacco: Never Used  Substance and Sexual Activity  . Alcohol use: No  . Drug use: No  . Sexual activity: Not on file  Lifestyle  . Physical activity:    Days per week: Not on file    Minutes per session: Not on file  . Stress: Not on file  Relationships  . Social connections:    Talks on phone: Not on file    Gets together: Not on file    Attends religious  service: Not on file    Active member of club or organization: Not on file    Attends meetings of clubs or organizations: Not on file    Relationship status: Not on file  . Intimate partner violence:    Fear of current or ex partner: Not on file    Emotionally abused: Not on file    Physically abused: Not on file    Forced sexual activity: Not on file  Other Topics Concern  . Not on file  Social History Narrative   [employer]-- vegetarian   Review of Systems Appetite is fine Sugars remain fine    Objective:   Physical Exam  Cardiovascular: Intact distal pulses.  Musculoskeletal:  Normal foot without any swelling, joint tenderness, etc          Assessment & Plan:

## 2017-12-23 NOTE — Assessment & Plan Note (Signed)
In right foot ?early neuropathy, mechanical nerve issue? Observe only for now

## 2017-12-31 ENCOUNTER — Encounter: Payer: Self-pay | Admitting: Internal Medicine

## 2018-02-15 ENCOUNTER — Other Ambulatory Visit: Payer: Self-pay | Admitting: Internal Medicine

## 2018-02-22 ENCOUNTER — Ambulatory Visit: Payer: Managed Care, Other (non HMO) | Admitting: Internal Medicine

## 2018-02-22 ENCOUNTER — Encounter: Payer: Self-pay | Admitting: Internal Medicine

## 2018-02-22 VITALS — BP 108/80 | HR 65 | Temp 98.5°F | Ht 75.0 in | Wt 229.0 lb

## 2018-02-22 DIAGNOSIS — N509 Disorder of male genital organs, unspecified: Secondary | ICD-10-CM | POA: Diagnosis not present

## 2018-02-22 NOTE — Progress Notes (Signed)
Subjective:    Patient ID: Hunter Richmond, male    DOB: Jun 14, 1961, 57 y.o.   MRN: [id number]  HPI Here due to discomfort in his right testicle  Noticed some sensation there over the past 3 weeks Felt a small knot on his right testicle Some tenderness in testicle Now some right lower abdominal discomfort  No dysuria or hematuria No sex in past month  Current Outpatient Medications on File Prior to Visit  Medication Sig Dispense Refill  . amLODipine (NORVASC) 10 MG tablet TAKE 1 TABLET BY MOUTH ONCE DAILY 90 tablet 3  . glucose blood (ONE TOUCH ULTRA TEST) test strip USE TO TEST BLOOD SUGAR ONCE DAILY 100 each 3  . lisinopril-hydrochlorothiazide (PRINZIDE,ZESTORETIC) 20-25 MG tablet TAKE 1 TABLET BY MOUTH ONCE DAILY 90 tablet 3  . metFORMIN (GLUCOPHAGE-XR) 500 MG 24 hr tablet TAKE 1 TO 2 TABLETS BY MOUTH ONCE DAILY WITH BREAKFAST 180 tablet 3  . pravastatin (PRAVACHOL) 20 MG tablet TAKE 1 TABLET BY MOUTH ONCE DAILY 90 tablet 1   No current facility-administered medications on file prior to visit.     No Known Allergies  Past Medical History:  Diagnosis Date  . Diabetes mellitus   . GERD (gastroesophageal reflux disease)   . Hyperlipidemia   . Hypertension     Past Surgical History:  Procedure Laterality Date  . FINGER SURGERY  1979   torn ligament  . WISDOM TOOTH EXTRACTION  1989    Family History  Problem Relation Age of Onset  . Diabetes Brother   . Cancer Maternal Aunt        breast cancer  . Cancer Maternal Uncle        throat  . Diabetes Sister   . Heart disease Neg Hx   . Colon cancer Neg Hx   . Esophageal cancer Neg Hx   . Rectal cancer Neg Hx   . Stomach cancer Neg Hx     Social History   Socioeconomic History  . Marital status: Married    Spouse name: Not on file  . Number of children: 1  . Years of education: Not on file  . Highest education level: Not on file  Occupational History  . Occupation: Truck Geophysicist/field seismologist    Comment: short and  occasionally overnight  . Occupation:    . Occupation:    Social Needs  . Financial resource strain: Not on file  . Food insecurity:    Worry: Not on file    Inability: Not on file  . Transportation needs:    Medical: Not on file    Non-medical: Not on file  Tobacco Use  . Smoking status: Never Smoker  . Smokeless tobacco: Never Used  Substance and Sexual Activity  . Alcohol use: No  . Drug use: No  . Sexual activity: Not on file  Lifestyle  . Physical activity:    Days per week: Not on file    Minutes per session: Not on file  . Stress: Not on file  Relationships  . Social connections:    Talks on phone: Not on file    Gets together: Not on file    Attends religious service: Not on file    Active member of club or organization: Not on file    Attends meetings of clubs or organizations: Not on file    Relationship status: Not on file  . Intimate partner violence:    Fear of current or ex partner: Not on file  Emotionally abused: Not on file    Physically abused: Not on file    Forced sexual activity: Not on file  Other Topics Concern  . Not on file  Social History Narrative   [employer]-- vegetarian   Review of Systems  No fever No back pain     Objective:   Physical Exam  Constitutional: He appears well-developed. No distress.  GI:  No abdominal mass or tenderness No hernia  Genitourinary:  Genitourinary Comments: Left testis is normal. Slight cystic changes at start of tubule Right testis has cystic type changes that may be at the onset of the tubule--but could be part of testis. Slight tenderness            Assessment & Plan:

## 2018-02-22 NOTE — Assessment & Plan Note (Signed)
May well be benign cystic area Out of typical range for testicular cancer but should be checked (?ultrasound) Will set up with urologist

## 2018-03-09 ENCOUNTER — Ambulatory Visit: Payer: Self-pay | Admitting: Internal Medicine

## 2018-03-09 NOTE — Telephone Encounter (Signed)
Duplicate triage encounter please disregard.

## 2018-03-09 NOTE — Telephone Encounter (Signed)
Ok to make a same day appointment tomorrow with DrCopland .Wound care and guidelines given to patient pt states he was cutting his toenail with clippers  Reason for Disposition . [1] Has diabetes (diabetes mellitus) AND [2] small cut or scrape  Answer Assessment - Initial Assessment Questions 1. MECHANISM: "How did the injury happen?"       Was cutting r 4th  toe when he cut into the skin  2. ONSET: "When did the injury happen?" (Minutes or hours ago)        1 and  1/2 hour  Ago   3. LOCATION: "What part of the toe is injured?" "Is the nail damaged?"         Tip of the toe in front part   4. APPEARANCE of TOE INJURY: "What does the injury look like?"           Small  Tip of the skin is removed  Small amount of bleeding   5. SEVERITY: "Can you use the foot normally?" "Can you walk?"            Yes   6. SIZE: For cuts, bruises, or swelling, ask: "How large is it?" (e.g., inches or centimeters;  entire toe)       Small   7. PAIN: "Is there pain?" If so, ask: "How bad is the pain?"   (e.g., Scale 1-10; or mild, moderate, severe)       NO  8. TETANUS: For any breaks in the skin, ask: "When was the last tetanus booster?"        2014   9. DIABETES: "Do you have a history of diabetes or poor circulation in the feet?"     Yes pt is a diabetic   10. OTHER SYMPTOMS: "Do you have any other symptoms?"        no 11. PREGNANCY: "Is there any chance you are pregnant?" "When was your last menstrual period?"       n/a  Protocols used: TOE INJURY-A-AH

## 2018-03-10 ENCOUNTER — Encounter: Payer: Self-pay | Admitting: Family Medicine

## 2018-03-10 ENCOUNTER — Ambulatory Visit: Payer: Managed Care, Other (non HMO) | Admitting: Family Medicine

## 2018-03-10 VITALS — BP 106/70 | HR 72 | Temp 98.8°F | Ht 75.0 in | Wt 231.0 lb

## 2018-03-10 DIAGNOSIS — S99921A Unspecified injury of right foot, initial encounter: Secondary | ICD-10-CM

## 2018-03-10 NOTE — Progress Notes (Signed)
Dr. Frederico Hamman T. Thomasine Klutts, MD, [facility] [place], [address] Phone: [phone] Fax: [phone]  03/10/2018  Patient: Hunter Richmond, MRN: [id number], DOB: 1960/12/31, 57 y.o.  Primary Physician:  Venia Carbon, MD   Chief Complaint  Patient presents with  . Toe Nail Issue    Cut too short/Diabetic-4th toe on right foot   Subjective:   DENYS SALINGER is a 57 y.o. very pleasant male patient who presents with the following:  4th R toe: Pleasant gentleman with diabetes, and he cut his toenails a little bit close several days ago, he has been having some pain.  This is on the fourth digit on the right.  There is no surrounding redness or warmth.  Actually thinks is better today compared to yesterday.  Past Medical History, Surgical History, Social History, Family History, Problem List, Medications, and Allergies have been reviewed and updated if relevant.  Patient Active Problem List   Diagnosis Date Noted  . Testicular lesion 02/22/2018  . Shift work sleep disorder 12/23/2017  . Sensation of change in temperature 12/23/2017  . Left shoulder strain, initial encounter 09/23/2017  . Edema 02/02/2017  . Routine general medical examination at a health care facility 04/14/2012  . Hyperlipemia 01/18/2008  . Diabetes mellitus type 2, controlled, without complications ([place]) 18/56/3149  . Essential hypertension, benign 07/20/2007  . GERD 07/20/2007    Past Medical History:  Diagnosis Date  . Diabetes mellitus   . GERD (gastroesophageal reflux disease)   . Hyperlipidemia   . Hypertension     Past Surgical History:  Procedure Laterality Date  . FINGER SURGERY  1979   torn ligament  . [place] EXTRACTION  1989    Social History   Socioeconomic History  . Marital status: Married    Spouse name: Not on file  . Number of children: 1  . Years of education: Not on file  . Highest education level: Not on file    Occupational History  . 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 Occupation:    . Occupation:    Social Needs  . Financial resource strain: Not on file  . Food insecurity:    Worry: Not on file    Inability: Not on file  . Transportation needs:    Medical: Not on file    Non-medical: Not on file  Tobacco Use  . Smoking status: Never Smoker  . Smokeless tobacco: Never Used  Substance and Sexual Activity  . Alcohol use: No  . Drug use: No  . Sexual activity: Not on file  Lifestyle  . Physical activity:    Days per week: Not on file    Minutes per session: Not on file  . Stress: Not on file  Relationships  . Social connections:    Talks on phone: Not on file    Gets together: Not on file    Attends religious service: Not on file    Active member of club or organization: Not on file    Attends meetings of clubs or organizations: Not on file    Relationship status: Not on file  . Intimate partner violence:    Fear of current or ex partner: Not on file    Emotionally abused: Not on file    Physically abused: Not on file    Forced sexual activity: Not on file  Other Topics Concern  . Not  on file  Social History Narrative   [employer]-- vegetarian    Family History  Problem Relation Age of Onset  . Diabetes Brother   . Cancer Maternal Aunt        breast cancer  . Cancer Maternal Uncle        throat  . Diabetes Sister   . Heart disease Neg Hx   . Colon cancer Neg Hx   . Esophageal cancer Neg Hx   . Rectal cancer Neg Hx   . Stomach cancer Neg Hx     No Known Allergies  Medication list reviewed and updated in full in [facility].   GEN: No acute illnesses, no fevers, chills. GI: No n/v/d, eating normally Pulm: No SOB Interactive and getting along well at home.  Otherwise, ROS is as per the HPI.  Objective:   BP 106/70   Pulse 72   Temp 98.8 F (37.1 C) (Oral)   Ht 6\' 3"  (1.905 m)   Wt 231 lb (104.8  kg)   BMI 28.87 kg/m   GEN: WDWN, NAD, Non-toxic, A & O x 3 HEENT: Atraumatic, Normocephalic. Neck supple. No masses, No LAD. Ears and Nose: No external deformity. EXTR: No c/c/e NEURO Normal gait.  PSYCH: Normally interactive. Conversant. Not depressed or anxious appearing.  Calm demeanor.    Toes and toenails are grossly nontender.  The fourth toe on the right does have some mild tenderness around the toenail with no signs of an active ingrown toenail.  Laboratory and Imaging Data:  Assessment and Plan:   Injury of toenail of right foot, initial encounter  Reassurance. Reviewed basic care of this type of injury  Follow-up: No follow-ups on file.  Signed,  Maud Deed. Erice Ahles, MD   Allergies as of 03/10/2018   No Known Allergies     Medication List        Accurate as of 03/10/18 11:59 PM. Always use your most recent med list.          amLODipine 10 MG tablet Commonly known as:  NORVASC TAKE 1 TABLET BY MOUTH ONCE DAILY   glucose blood test strip Commonly known as:  ONE TOUCH ULTRA TEST USE TO TEST BLOOD SUGAR ONCE DAILY   ibuprofen 800 MG tablet Commonly known as:  ADVIL,MOTRIN   lisinopril-hydrochlorothiazide 20-25 MG tablet Commonly known as:  PRINZIDE,ZESTORETIC TAKE 1 TABLET BY MOUTH ONCE DAILY   metFORMIN 500 MG 24 hr tablet Commonly known as:  GLUCOPHAGE-XR TAKE 1 TO 2 TABLETS BY MOUTH ONCE DAILY WITH BREAKFAST   pravastatin 20 MG tablet Commonly known as:  PRAVACHOL TAKE 1 TABLET BY MOUTH ONCE DAILY

## 2018-03-22 ENCOUNTER — Encounter: Payer: Self-pay | Admitting: Internal Medicine

## 2018-03-22 ENCOUNTER — Ambulatory Visit (INDEPENDENT_AMBULATORY_CARE_PROVIDER_SITE_OTHER): Payer: Managed Care, Other (non HMO) | Admitting: Internal Medicine

## 2018-03-22 VITALS — BP 122/84 | HR 61 | Temp 98.2°F | Ht 75.5 in | Wt 229.0 lb

## 2018-03-22 DIAGNOSIS — Z0000 Encounter for general adult medical examination without abnormal findings: Secondary | ICD-10-CM

## 2018-03-22 DIAGNOSIS — E119 Type 2 diabetes mellitus without complications: Secondary | ICD-10-CM | POA: Diagnosis not present

## 2018-03-22 DIAGNOSIS — K219 Gastro-esophageal reflux disease without esophagitis: Secondary | ICD-10-CM

## 2018-03-22 DIAGNOSIS — I10 Essential (primary) hypertension: Secondary | ICD-10-CM | POA: Diagnosis not present

## 2018-03-22 LAB — POCT GLYCOSYLATED HEMOGLOBIN (HGB A1C): HEMOGLOBIN A1C: 6.3 % — AB (ref 4.0–5.6)

## 2018-03-22 NOTE — Assessment & Plan Note (Signed)
Rare symptoms

## 2018-03-22 NOTE — Progress Notes (Signed)
Subjective:    Patient ID: Hunter Richmond, male    DOB: 01-Nov-1960, 57 y.o.   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 amLODipine (NORVASC) 10 MG tablet TAKE 1 TABLET BY MOUTH ONCE DAILY 90 tablet 3  . glucose blood (ONE TOUCH ULTRA TEST) test strip USE TO TEST BLOOD SUGAR ONCE DAILY 100 each 3  . ibuprofen (ADVIL,MOTRIN) 800 MG tablet     . lisinopril-hydrochlorothiazide (PRINZIDE,ZESTORETIC) 20-25 MG tablet TAKE 1 TABLET BY MOUTH ONCE DAILY 90 tablet 3  . metFORMIN (GLUCOPHAGE-XR) 500 MG 24 hr tablet TAKE 1 TO 2 TABLETS BY MOUTH ONCE DAILY WITH BREAKFAST 180 tablet 3  . pravastatin (PRAVACHOL) 20 MG tablet TAKE 1 TABLET BY MOUTH ONCE DAILY 90 tablet 1   No current facility-administered medications on file prior to visit.     No Known Allergies  Past Medical History:  Diagnosis Date  . Diabetes mellitus   . GERD (gastroesophageal reflux disease)   . Hyperlipidemia   . Hypertension     Past Surgical History:  Procedure Laterality Date  . FINGER SURGERY  1979   torn ligament  . WISDOM TOOTH EXTRACTION  1989    Family History  Problem Relation Age of Onset  . Diabetes Brother   . Cancer Maternal Aunt        breast cancer  . Cancer Maternal Uncle        throat  . Diabetes Sister   . Heart disease Neg Hx   . Colon cancer Neg Hx   . Esophageal cancer Neg Hx   . Rectal cancer Neg Hx   . Stomach cancer Neg Hx     Social History   Socioeconomic History  . Marital status: Married     Spouse name: Not on file  . Number of children: 1  . Years of education: Not on file  . Highest education level: Not on file  Occupational History  . 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 Occupation:    . Occupation:    Social Needs  . Financial resource strain: Not on file  . Food insecurity:    Worry: Not on file    Inability: Not on file  . Transportation needs:    Medical: Not on file    Non-medical: Not on file  Tobacco Use  . Smoking status: Never Smoker  . Smokeless tobacco: Never Used  Substance and Sexual Activity  . Alcohol use: No  . Drug use: No  . Sexual activity: Not on file  Lifestyle  . Physical activity:    Days per week: Not on file    Minutes per session: Not on file  . Stress: Not on file  Relationships  . Social connections:    Talks on phone: Not on file    Gets together: Not on file    Attends religious service: Not on file    Active member of club or organization: Not on file  Attends meetings of clubs or organizations: Not on file    Relationship status: Not on file  . Intimate partner violence:    Fear of current or ex partner: Not on file    Emotionally abused: Not on file    Physically abused: Not on file    Forced sexual activity: Not on file  Other Topics Concern  . Not on file  Social History Narrative   [employer]-- vegetarian   Review of Systems  Constitutional: Negative for fatigue and unexpected weight change.       Wears seat belt  HENT: Negative for hearing loss, tinnitus and trouble swallowing.        Keeps up with dentist  Eyes: Negative for visual disturbance.       No diplopia or unilateral vision loss  Respiratory: Negative for cough, chest tightness and shortness of breath.   Cardiovascular: Negative for chest pain and palpitations.       Slight swelling if driving all day--discussed support socks  Gastrointestinal: Negative for blood in stool and constipation.        Rare acid reflux-- will use tums prn  Endocrine: Negative for polydipsia and polyuria.  Genitourinary: Negative for urgency.       Slight dribbling at times Decreased libido-no ED  Musculoskeletal: Negative for arthralgias, back pain and joint swelling.  Skin: Negative for rash.       No suspicious lesions  Allergic/Immunologic: Negative for environmental allergies and immunocompromised state.  Neurological: Negative for dizziness, syncope, light-headedness and headaches.  Hematological: Negative for adenopathy. Does not bruise/bleed easily.  Psychiatric/Behavioral: Negative for dysphoric mood and sleep disturbance. The patient is not nervous/anxious.        Objective:   Physical Exam  Constitutional: He is oriented to person, place, and time. He appears well-developed. No distress.  HENT:  Head: Normocephalic and atraumatic.  Right Ear: External ear normal.  Left Ear: External ear normal.  Mouth/Throat: Oropharynx is clear and moist. No oropharyngeal exudate.  Eyes: Pupils are equal, round, and reactive to light. Conjunctivae are normal.  Neck: No thyromegaly present.  Cardiovascular: Normal rate, regular rhythm, normal heart sounds and intact distal pulses. Exam reveals no gallop.  No murmur heard. Respiratory: Effort normal and breath sounds normal. No respiratory distress. He has no wheezes. He has no rales.  GI: Soft. There is no tenderness.  Musculoskeletal: He exhibits no edema or tenderness.  Lymphadenopathy:    He has no cervical adenopathy.  Neurological: He is alert and oriented to person, place, and time.  Skin: No rash noted. No erythema.  Psychiatric: He has a normal mood and affect. His behavior is normal.           Assessment & Plan:

## 2018-03-22 NOTE — Patient Instructions (Signed)
Please look into support socks for when you are driving.

## 2018-03-22 NOTE — Assessment & Plan Note (Signed)
BP Readings from Last 3 Encounters:  03/22/18 122/84  03/10/18 106/70  02/22/18 108/80   Good control  no changes needed

## 2018-03-22 NOTE — Assessment & Plan Note (Signed)
Lab Results  Component Value Date   HGBA1C 6.3 (A) 03/22/2018   Excellent control  Discussed--he can take 2 metformin daily

## 2018-03-22 NOTE — Assessment & Plan Note (Addendum)
Healthy Colon due again next June Defer PSA---consider at next visit Keeps in shape Yearly flu vaccine recommended

## 2018-06-04 ENCOUNTER — Telehealth: Payer: Self-pay | Admitting: Internal Medicine

## 2018-06-04 NOTE — Telephone Encounter (Signed)
Pt dropped off form to be signed for [employer]. Placed in [place] tower. Aware Dr. Silvio Pate will be out of office until next week

## 2018-06-07 NOTE — Telephone Encounter (Signed)
Form placed in Dr Alla German Inbox to complete and sign when he returns

## 2018-06-08 NOTE — Telephone Encounter (Signed)
Form done No charge 

## 2018-06-08 NOTE — Telephone Encounter (Signed)
I left a message on patient's voice mail that form is ready for pick up. °

## 2018-08-04 ENCOUNTER — Encounter: Payer: Self-pay | Admitting: Internal Medicine

## 2018-08-04 ENCOUNTER — Ambulatory Visit: Payer: 59 | Admitting: Internal Medicine

## 2018-08-04 VITALS — BP 110/70 | HR 69 | Temp 98.3°F | Ht 76.0 in | Wt 233.0 lb

## 2018-08-04 DIAGNOSIS — E119 Type 2 diabetes mellitus without complications: Secondary | ICD-10-CM

## 2018-08-04 DIAGNOSIS — Z23 Encounter for immunization: Secondary | ICD-10-CM

## 2018-08-04 DIAGNOSIS — M7581 Other shoulder lesions, right shoulder: Secondary | ICD-10-CM

## 2018-08-04 DIAGNOSIS — M25511 Pain in right shoulder: Secondary | ICD-10-CM | POA: Insufficient documentation

## 2018-08-04 DIAGNOSIS — M778 Other enthesopathies, not elsewhere classified: Secondary | ICD-10-CM

## 2018-08-04 LAB — POCT GLYCOSYLATED HEMOGLOBIN (HGB A1C): Hemoglobin A1C: 6.5 % — AB (ref 4.0–5.6)

## 2018-08-04 NOTE — Assessment & Plan Note (Signed)
Discussed technique with his curls Okay to resume Self limited problem---reassured

## 2018-08-04 NOTE — Progress Notes (Signed)
Subjective:    Patient ID: Hunter Richmond, male    DOB: 03-24-1961, 57 y.o.   MRN: [id number]  HPI Here due to right shoulder pain Has gone back for a month or more--set up appt 2 weeks ago and is some better Milder symptoms in left shoulder Wondered if his dumbbell curls--so he stopped  Points to anterior portion and down biceps at times Noticed it mainly at night---sleeps on his back Ibuprofen did help   Current Outpatient Medications on File Prior to Visit  Medication Sig Dispense Refill  . amLODipine (NORVASC) 10 MG tablet TAKE 1 TABLET BY MOUTH ONCE DAILY 90 tablet 3  . glucose blood (ONE TOUCH ULTRA TEST) test strip USE TO TEST BLOOD SUGAR ONCE DAILY 100 each 3  . ibuprofen (ADVIL,MOTRIN) 800 MG tablet     . lisinopril-hydrochlorothiazide (PRINZIDE,ZESTORETIC) 20-25 MG tablet TAKE 1 TABLET BY MOUTH ONCE DAILY 90 tablet 3  . metFORMIN (GLUCOPHAGE-XR) 500 MG 24 hr tablet TAKE 1 TO 2 TABLETS BY MOUTH ONCE DAILY WITH BREAKFAST 180 tablet 3  . pravastatin (PRAVACHOL) 20 MG tablet TAKE 1 TABLET BY MOUTH ONCE DAILY 90 tablet 1   No current facility-administered medications on file prior to visit.     No Known Allergies  Past Medical History:  Diagnosis Date  . Diabetes mellitus   . GERD (gastroesophageal reflux disease)   . Hyperlipidemia   . Hypertension     Past Surgical History:  Procedure Laterality Date  . FINGER SURGERY  1979   torn ligament  . WISDOM TOOTH EXTRACTION  1989    Family History  Problem Relation Age of Onset  . Diabetes Brother   . Cancer Maternal Aunt        breast cancer  . Cancer Maternal Uncle        throat  . Diabetes Sister   . Heart disease Neg Hx   . Colon cancer Neg Hx   . Esophageal cancer Neg Hx   . Rectal cancer Neg Hx   . Stomach cancer Neg Hx     Social History   Socioeconomic History  . Marital status: Married    Spouse name: Not on file  . Number of children: 1  . Years of education: Not on file  . Highest education  level: Not on file  Occupational History  . 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 Occupation:    . Occupation:    Social Needs  . Financial resource strain: Not on file  . Food insecurity:    Worry: Not on file    Inability: Not on file  . Transportation needs:    Medical: Not on file    Non-medical: Not on file  Tobacco Use  . Smoking status: Never Smoker  . Smokeless tobacco: Never Used  Substance and Sexual Activity  . Alcohol use: No  . Drug use: No  . Sexual activity: Not on file  Lifestyle  . Physical activity:    Days per week: Not on file    Minutes per session: Not on file  . Stress: Not on file  Relationships  . Social connections:    Talks on phone: Not on file    Gets together: Not on file    Attends religious service: Not on file    Active member of club or organization: Not on file    Attends meetings of clubs or organizations: Not on file    Relationship status:  Not on file  . Intimate partner violence:    Fear of current or ex partner: Not on file    Emotionally abused: Not on file    Physically abused: Not on file    Forced sexual activity: Not on file  Other Topics Concern  . Not on file  Social History Narrative   [employer]-- vegetarian   Review of Systems No other joint swelling Some stiffness in DIP of right thumb---clicks at times    Objective:   Physical Exam  Musculoskeletal:  No localized tenderness in either shoulder Passive ROM is fairly full  Right thumb does click at PIP but not inflamed           Assessment & Plan:

## 2018-08-04 NOTE — Assessment & Plan Note (Addendum)
Control seems fine Will check A1c  Lab Results  Component Value Date   HGBA1C 6.5 (A) 08/04/2018   Good control still No changes

## 2018-08-13 ENCOUNTER — Other Ambulatory Visit: Payer: Self-pay | Admitting: Internal Medicine

## 2018-09-10 ENCOUNTER — Encounter: Payer: Self-pay | Admitting: Internal Medicine

## 2018-09-10 ENCOUNTER — Ambulatory Visit: Payer: 59 | Admitting: Internal Medicine

## 2018-09-10 VITALS — BP 112/70 | HR 76 | Temp 98.2°F | Resp 12 | Ht 76.0 in | Wt 223.0 lb

## 2018-09-10 DIAGNOSIS — M25511 Pain in right shoulder: Secondary | ICD-10-CM

## 2018-09-10 DIAGNOSIS — J069 Acute upper respiratory infection, unspecified: Secondary | ICD-10-CM | POA: Diagnosis not present

## 2018-09-10 NOTE — Assessment & Plan Note (Signed)
Will refer to ortho

## 2018-09-10 NOTE — Assessment & Plan Note (Signed)
Could be attenuated flu or other viral infection No evidence pneumonia Supportive care

## 2018-09-10 NOTE — Progress Notes (Signed)
Subjective:    Patient ID: Hunter Richmond, male    DOB: 09/10/1961, 57 y.o.   MRN: [id number]  HPI Here due to respiratory infection  Started 3 days ago Fever, chills, nasal discharge slightly better today Concerned about his lungs Some sore throat Some cough--better today No ear pain Some chills and sweats--- better now No SOB  Tried theraflu and nyquil---some help  Current Outpatient Medications on File Prior to Visit  Medication Sig Dispense Refill  . amLODipine (NORVASC) 10 MG tablet TAKE 1 TABLET BY MOUTH ONCE DAILY 90 tablet 3  . glucose blood (ONE TOUCH ULTRA TEST) test strip USE TO TEST BLOOD SUGAR ONCE DAILY 100 each 3  . ibuprofen (ADVIL,MOTRIN) 800 MG tablet     . lisinopril-hydrochlorothiazide (PRINZIDE,ZESTORETIC) 20-25 MG tablet TAKE 1 TABLET BY MOUTH ONCE DAILY 90 tablet 3  . metFORMIN (GLUCOPHAGE-XR) 500 MG 24 hr tablet TAKE 1 TO 2 TABLETS BY MOUTH ONCE DAILY WITH BREAKFAST 180 tablet 3  . pravastatin (PRAVACHOL) 20 MG tablet TAKE 1 TABLET BY MOUTH ONCE DAILY 90 tablet 3   No current facility-administered medications on file prior to visit.     No Known Allergies  Past Medical History:  Diagnosis Date  . Diabetes mellitus   . GERD (gastroesophageal reflux disease)   . Hyperlipidemia   . Hypertension     Past Surgical History:  Procedure Laterality Date  . FINGER SURGERY  1979   torn ligament  . WISDOM TOOTH EXTRACTION  1989    Family History  Problem Relation Age of Onset  . Diabetes Brother   . Cancer Maternal Aunt        breast cancer  . Cancer Maternal Uncle        throat  . Diabetes Sister   . Heart disease Neg Hx   . Colon cancer Neg Hx   . Esophageal cancer Neg Hx   . Rectal cancer Neg Hx   . Stomach cancer Neg Hx     Social History   Socioeconomic History  . Marital status: Married    Spouse name: Not on file  . Number of children: 1  . Years of education: Not on file  . Highest education level: Not on file  Occupational  History  . 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 Occupation:    . Occupation:    Social Needs  . Financial resource strain: Not on file  . Food insecurity:    Worry: Not on file    Inability: Not on file  . Transportation needs:    Medical: Not on file    Non-medical: Not on file  Tobacco Use  . Smoking status: Never Smoker  . Smokeless tobacco: Never Used  Substance and Sexual Activity  . Alcohol use: No  . Drug use: No  . Sexual activity: Not on file  Lifestyle  . Physical activity:    Days per week: Not on file    Minutes per session: Not on file  . Stress: Not on file  Relationships  . Social connections:    Talks on phone: Not on file    Gets together: Not on file    Attends religious service: Not on file    Active member of club or organization: Not on file    Attends meetings of clubs or organizations: Not on file    Relationship status: Not on file  . Intimate partner violence:    Fear of  current or ex partner: Not on file    Emotionally abused: Not on file    Physically abused: Not on file    Forced sexual activity: Not on file  Other Topics Concern  . Not on file  Social History Narrative   [employer]-- vegetarian   Review of Systems  No vomiting or diarrhea Slight nausea 2 days ago Appetite is off Still having shoulder problems--- would like referral Some muscle aches     Objective:   Physical Exam  Constitutional: No distress.  Appears somewhat tired  HENT:  No sinus tenderness Mild nasal inflammation TMs normal Slight pharyngeal injection  Neck: No thyromegaly present.  Respiratory: Effort normal and breath sounds normal. No respiratory distress. He has no wheezes. He has no rales.  Lymphadenopathy:    He has no cervical adenopathy.           Assessment & Plan:

## 2018-09-22 ENCOUNTER — Ambulatory Visit: Payer: Managed Care, Other (non HMO) | Admitting: Internal Medicine

## 2018-10-24 ENCOUNTER — Other Ambulatory Visit: Payer: Self-pay | Admitting: Internal Medicine

## 2019-01-26 DIAGNOSIS — M65311 Trigger thumb, right thumb: Secondary | ICD-10-CM | POA: Diagnosis not present

## 2019-03-03 ENCOUNTER — Encounter: Payer: Self-pay | Admitting: Gastroenterology

## 2019-03-28 ENCOUNTER — Encounter: Payer: 59 | Admitting: Internal Medicine

## 2019-05-10 ENCOUNTER — Other Ambulatory Visit: Payer: Self-pay | Admitting: Internal Medicine

## 2019-05-31 ENCOUNTER — Ambulatory Visit: Payer: BC Managed Care – PPO | Admitting: Internal Medicine

## 2019-05-31 ENCOUNTER — Encounter: Payer: Self-pay | Admitting: Internal Medicine

## 2019-05-31 ENCOUNTER — Other Ambulatory Visit: Payer: Self-pay

## 2019-05-31 VITALS — BP 116/80 | HR 79 | Temp 98.4°F | Ht 76.0 in | Wt 234.0 lb

## 2019-05-31 DIAGNOSIS — K921 Melena: Secondary | ICD-10-CM | POA: Insufficient documentation

## 2019-05-31 DIAGNOSIS — L600 Ingrowing nail: Secondary | ICD-10-CM | POA: Diagnosis not present

## 2019-05-31 DIAGNOSIS — Z23 Encounter for immunization: Secondary | ICD-10-CM

## 2019-05-31 NOTE — Assessment & Plan Note (Signed)
From pepto bismol Is due for repeat colon now anyway He will set this up

## 2019-05-31 NOTE — Progress Notes (Signed)
Subjective:    Patient ID: Hunter Richmond, male    DOB: 1961/06/01, 58 y.o.   MRN: [id number]  HPI  Here due to black stool and worried about an ingrown toenail  Having stomach rumbling and diarrhea No blood then 3-4 days ago Slight stomach pain 3 days ago--took some pepto bismol Then saw black stool yesterday GI symptoms are gone  ~1 week ago, left great toe started with some pain Noted slight bleeding Seems to be ingrown on medial side Soaking with peroxide Now with more pain  Current Outpatient Medications on File Prior to Visit  Medication Sig Dispense Refill  . amLODipine (NORVASC) 10 MG tablet Take 1 tablet by mouth once daily 90 tablet 0  . glucose blood (ONE TOUCH ULTRA TEST) test strip USE TO TEST BLOOD SUGAR ONCE DAILY 100 each 3  . ibuprofen (ADVIL,MOTRIN) 800 MG tablet     . lisinopril-hydrochlorothiazide (ZESTORETIC) 20-25 MG tablet Take 1 tablet by mouth once daily 90 tablet 0  . metFORMIN (GLUCOPHAGE-XR) 500 MG 24 hr tablet TAKE 1 TO 2 TABLETS BY MOUTH ONCE DAILY WITH BREAKFAST 180 tablet 0  . pravastatin (PRAVACHOL) 20 MG tablet TAKE 1 TABLET BY MOUTH ONCE DAILY 90 tablet 3   No current facility-administered medications on file prior to visit.     No Known Allergies  Past Medical History:  Diagnosis Date  . Diabetes mellitus   . GERD (gastroesophageal reflux disease)   . Hyperlipidemia   . Hypertension     Past Surgical History:  Procedure Laterality Date  . FINGER SURGERY  1979   torn ligament  . WISDOM TOOTH EXTRACTION  1989    Family History  Problem Relation Age of Onset  . Diabetes Brother   . Cancer Maternal Aunt        breast cancer  . Cancer Maternal Uncle        throat  . Diabetes Sister   . Heart disease Neg Hx   . Colon cancer Neg Hx   . Esophageal cancer Neg Hx   . Rectal cancer Neg Hx   . Stomach cancer Neg Hx     Social History   Socioeconomic History  . Marital status: Married    Spouse name: Not on file  . Number  of children: 1  . Years of education: Not on file  . Highest education level: Not on file  Occupational History  . 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 Occupation:    . Occupation:    Social Needs  . Financial resource strain: Not on file  . Food insecurity    Worry: Not on file    Inability: Not on file  . Transportation needs    Medical: Not on file    Non-medical: Not on file  Tobacco Use  . Smoking status: Never Smoker  . Smokeless tobacco: Never Used  Substance and Sexual Activity  . Alcohol use: No  . Drug use: No  . Sexual activity: Not on file  Lifestyle  . Physical activity    Days per week: Not on file    Minutes per session: Not on file  . Stress: Not on file  Relationships  . Social Herbalist on phone: Not on file    Gets together: Not on file    Attends religious service: Not on file    Active member of club or organization: Not on file    Attends  meetings of clubs or organizations: Not on file    Relationship status: Not on file  . Intimate partner violence    Fear of current or ex partner: Not on file    Emotionally abused: Not on file    Physically abused: Not on file    Forced sexual activity: Not on file  Other Topics Concern  . Not on file  Social History Narrative   [employer]-- vegetarian   Review of Systems Appetite is fine Weight is stable No fever    Objective:   Physical Exam  Musculoskeletal:     Comments: Mild distal ingrowing medial side of left great toe Slight tenderness Not really inflamed Slight dried blood  Clippers used and nail trimmed down slightly along medial border           Assessment & Plan:

## 2019-05-31 NOTE — Addendum Note (Signed)
Addended by: Pilar Grammes on: 05/31/2019 02:47 PM   Modules accepted: Orders

## 2019-05-31 NOTE — Assessment & Plan Note (Signed)
Minor  Cut back today Can try soaks No infection

## 2019-06-09 ENCOUNTER — Encounter: Payer: Self-pay | Admitting: Gastroenterology

## 2019-06-23 ENCOUNTER — Ambulatory Visit (AMBULATORY_SURGERY_CENTER): Payer: Self-pay | Admitting: *Deleted

## 2019-06-23 ENCOUNTER — Other Ambulatory Visit: Payer: Self-pay

## 2019-06-23 ENCOUNTER — Encounter: Payer: Self-pay | Admitting: Gastroenterology

## 2019-06-23 VITALS — Temp 96.9°F | Ht 76.0 in | Wt 235.0 lb

## 2019-06-23 DIAGNOSIS — Z86010 Personal history of colonic polyps: Secondary | ICD-10-CM

## 2019-06-23 MED ORDER — SUPREP BOWEL PREP KIT 17.5-3.13-1.6 GM/177ML PO SOLN: 1.0000 | Freq: Once | ORAL | 0 refills | Status: AC

## 2019-06-23 NOTE — Progress Notes (Signed)

## 2019-06-29 ENCOUNTER — Other Ambulatory Visit: Payer: Self-pay | Admitting: Internal Medicine

## 2019-07-06 ENCOUNTER — Telehealth: Payer: Self-pay | Admitting: Gastroenterology

## 2019-07-07 ENCOUNTER — Other Ambulatory Visit: Payer: Self-pay

## 2019-07-07 ENCOUNTER — Ambulatory Visit (AMBULATORY_SURGERY_CENTER): Payer: BC Managed Care – PPO | Admitting: Gastroenterology

## 2019-07-07 ENCOUNTER — Encounter: Payer: Self-pay | Admitting: Gastroenterology

## 2019-07-07 VITALS — BP 122/72 | HR 62 | Temp 98.7°F | Resp 20 | Ht 76.0 in | Wt 235.0 lb

## 2019-07-07 DIAGNOSIS — D123 Benign neoplasm of transverse colon: Secondary | ICD-10-CM

## 2019-07-07 DIAGNOSIS — D125 Benign neoplasm of sigmoid colon: Secondary | ICD-10-CM

## 2019-07-07 DIAGNOSIS — Z86010 Personal history of colonic polyps: Secondary | ICD-10-CM | POA: Diagnosis not present

## 2019-07-07 DIAGNOSIS — Z1211 Encounter for screening for malignant neoplasm of colon: Secondary | ICD-10-CM | POA: Diagnosis not present

## 2019-07-07 DIAGNOSIS — D120 Benign neoplasm of cecum: Secondary | ICD-10-CM

## 2019-07-07 DIAGNOSIS — D122 Benign neoplasm of ascending colon: Secondary | ICD-10-CM | POA: Diagnosis not present

## 2019-07-07 HISTORY — PX: COLONOSCOPY: SHX174

## 2019-07-07 MED ORDER — SODIUM CHLORIDE 0.9 % IV SOLN: 500.0000 mL | Freq: Once | INTRAVENOUS | Status: DC

## 2019-07-07 NOTE — Op Note (Signed)
[facility] Patient Name: Hunter Richmond Procedure Date: 07/07/2019 1:24 PM MRN: [id number] Endoscopist: Remo Lipps P. Havery Moros , MD Age: 58 Referring MD:  Date of Birth: 01-May-1961 Gender: Male Account #: [id number] Procedure:                Colonoscopy Indications:              Surveillance: Personal history of multiple                            adenomatous polyps on last colonoscopy 3 years ago Medicines:                Monitored Anesthesia Care Procedure:                Pre-Anesthesia Assessment:                           - Prior to the procedure, a History and Physical                            was performed, and patient medications and                            allergies were reviewed. The patient's tolerance of                            previous anesthesia was also reviewed. The risks                            and benefits of the procedure and the sedation                            options and risks were discussed with the patient.                            All questions were answered, and informed consent                            was obtained. Prior Anticoagulants: The patient has                            taken no previous anticoagulant or antiplatelet                            agents. ASA Grade Assessment: III - A patient with                            severe systemic disease. After reviewing the risks                            and benefits, the patient was deemed in                            satisfactory condition to undergo the procedure.  After obtaining informed consent, the colonoscope                            was passed under direct vision. Throughout the                            procedure, the patient's blood pressure, pulse, and                            oxygen saturations were monitored continuously. The                            Colonoscope was introduced through the anus and                            advanced to  the the cecum, identified by                            appendiceal orifice and ileocecal valve. The                            colonoscopy was performed without difficulty. The                            patient tolerated the procedure well. The quality                            of the bowel preparation was good. The ileocecal                            valve, appendiceal orifice, and rectum were                            photographed. Scope In: 1:33:02 PM Scope Out: 2:03:55 PM Scope Withdrawal Time: 0 hours 22 minutes 14 seconds  Total Procedure Duration: 0 hours 30 minutes 53 seconds  Findings:                 The perianal and digital rectal examinations were                            normal.                           The colon was extremely tortuous which prolonged                            cecal intubation and the exam.                           A 3 mm polyp was found in the cecum. The polyp was                            sessile. The polyp was removed with a cold snare.  Resection and retrieval were complete.                           A 8 mm polyp was found in the ascending colon. The                            polyp was sessile. The polyp was removed with a                            cold snare. Resection and retrieval were complete.                           A 3 mm polyp was found in the hepatic flexure. The                            polyp was sessile. The polyp was removed with a                            cold snare. Resection and retrieval were complete.                           Five sessile polyps were found in the transverse                            colon. The polyps were 3 to 4 mm in size. These                            polyps were removed with a cold snare. Resection                            and retrieval were complete.                           A 3 mm polyp was found in the sigmoid colon. The                            polyp was  sessile. The polyp was removed with a                            cold snare. Resection and retrieval were complete.                           Internal hemorrhoids were found during                            retroflexion. The hemorrhoids were small.                           The exam was otherwise normal throughout the                            examined colon. Complications:  No immediate complications. Estimated blood loss:                            Minimal. Estimated Blood Loss:     Estimated blood loss was minimal. Impression:               - Tortuous colon.                           - One 3 mm polyp in the cecum, removed with a cold                            snare. Resected and retrieved.                           - One 8 mm polyp in the ascending colon, removed                            with a cold snare. Resected and retrieved.                           - One 3 mm polyp at the hepatic flexure, removed                            with a cold snare. Resected and retrieved.                           - Five 3 to 4 mm polyps in the transverse colon,                            removed with a cold snare. Resected and retrieved.                           - One 3 mm polyp in the sigmoid colon, removed with                            a cold snare. Resected and retrieved.                           - Internal hemorrhoids. Recommendation:           - Patient has a contact number available for                            emergencies. The signs and symptoms of potential                            delayed complications were discussed with the                            patient. Return to normal activities tomorrow.                            Written discharge instructions were provided to the  patient.                           - Resume previous diet.                           - Continue present medications.                           - Await pathology results.  Anticipate repeat                            colonoscopy in 3 years. Remo Lipps P. Armbruster, MD 07/07/2019 2:09:41 PM This report has been signed electronically.

## 2019-07-07 NOTE — Progress Notes (Signed)
Pt's states no medical or surgical changes since previsit or office visit.  Temp taken by JB VS taken by CW 

## 2019-07-07 NOTE — Patient Instructions (Signed)
Handouts given :  Hemorrhoids and polyps  OU HAD AN ENDOSCOPIC PROCEDURE TODAY AT [place]:   Refer to the procedure report that was given to you for any specific questions about what was found during the examination.  If the procedure report does not answer your questions, please call your gastroenterologist to clarify.  If you requested that your care partner not be given the details of your procedure findings, then the procedure report has been included in a sealed envelope for you to review at your convenience later.  YOU SHOULD EXPECT: Some feelings of bloating in the abdomen. Passage of more gas than usual.  Walking can help get rid of the air that was put into your GI tract during the procedure and reduce the bloating. If you had a lower endoscopy (such as a colonoscopy or flexible sigmoidoscopy) you may notice spotting of blood in your stool or on the toilet paper. If you underwent a bowel prep for your procedure, you may not have a normal bowel movement for a few days.  Please Note:  You might notice some irritation and congestion in your nose or some drainage.  This is from the oxygen used during your procedure.  There is no need for concern and it should clear up in a day or so.  SYMPTOMS TO REPORT IMMEDIATELY:   Following lower endoscopy (colonoscopy or flexible sigmoidoscopy):  Excessive amounts of blood in the stool  Significant tenderness or worsening of abdominal pains  Swelling of the abdomen that is new, acute  Fever of 100F or higher    For urgent or emergent issues, a gastroenterologist can be reached at any hour by calling [phone].   DIET:  We do recommend a small meal at first, but then you may proceed to your regular diet.  Drink plenty of fluids but you should avoid alcoholic beverages for 24 hours.  ACTIVITY:  You should plan to take it easy for the rest of today and you should NOT DRIVE or use heavy machinery until tomorrow (because of the  sedation medicines used during the test).    FOLLOW UP: Our staff will call the number listed on your records 48-72 hours following your procedure to check on you and address any questions or concerns that you may have regarding the information given to you following your procedure. If we do not reach you, we will leave a message.  We will attempt to reach you two times.  During this call, we will ask if you have developed any symptoms of COVID 19. If you develop any symptoms (ie: fever, flu-like symptoms, shortness of breath, cough etc.) before then, please call [phone].  If you test positive for Covid 19 in the 2 weeks post procedure, please call and report this information to [place].    If any biopsies were taken you will be contacted by phone or by letter within the next 1-3 weeks.  Please call us at [phone] if you have not heard about the biopsies in 3 weeks.    SIGNATURES/CONFIDENTIALITY: You and/or your care partner have signed paperwork which will be entered into your electronic medical record.  These signatures attest to the fact that that the information above on your After Visit Summary has been reviewed and is understood.  Full responsibility of the confidentiality of this discharge information lies with you and/or your care-partner.

## 2019-07-07 NOTE — Progress Notes (Signed)
A and O x3. Report to RN. Tolerated MAC anesthesia well.

## 2019-07-07 NOTE — Progress Notes (Signed)
Called to room to assist during endoscopic procedure.  Patient ID and intended procedure confirmed with present staff. Received instructions for my participation in the procedure from the performing physician.  

## 2019-07-11 ENCOUNTER — Telehealth: Payer: Self-pay

## 2019-07-11 NOTE — Telephone Encounter (Signed)
  Follow up Call-  Call back number 07/07/2019  Post procedure Call Back phone  # [phone]  Permission to leave phone message Yes  Some recent data might be hidden     Patient questions:  Do you have a fever, pain , or abdominal swelling? No. Pain Score  0 *  Have you tolerated food without any problems? Yes.    Have you been able to return to your normal activities? Yes.    Do you have any questions about your discharge instructions: Diet   No. Medications  No. Follow up visit  No.  Do you have questions or concerns about your Care? No.  Actions: * If pain score is 4 or above: No action needed, pain <4.  1. Have you developed a fever since your procedure? no  2.   Have you had an respiratory symptoms (SOB or cough) since your procedure? no  3.   Have you tested positive for COVID 19 since your procedure no  4.   Have you had any family members/close contacts diagnosed with the COVID 19 since your procedure?  no   If yes to any of these questions please route to Joylene John, RN and Alphonsa Gin, Therapist, sports.

## 2019-08-01 ENCOUNTER — Encounter: Payer: Self-pay | Admitting: Internal Medicine

## 2019-08-01 ENCOUNTER — Other Ambulatory Visit: Payer: Self-pay

## 2019-08-01 ENCOUNTER — Other Ambulatory Visit: Payer: Self-pay | Admitting: Internal Medicine

## 2019-08-01 ENCOUNTER — Ambulatory Visit (INDEPENDENT_AMBULATORY_CARE_PROVIDER_SITE_OTHER): Payer: BC Managed Care – PPO | Admitting: Internal Medicine

## 2019-08-01 VITALS — BP 110/70 | HR 65 | Temp 98.3°F | Ht 75.5 in | Wt 230.0 lb

## 2019-08-01 DIAGNOSIS — Z0000 Encounter for general adult medical examination without abnormal findings: Secondary | ICD-10-CM

## 2019-08-01 DIAGNOSIS — E1140 Type 2 diabetes mellitus with diabetic neuropathy, unspecified: Secondary | ICD-10-CM | POA: Diagnosis not present

## 2019-08-01 DIAGNOSIS — Z125 Encounter for screening for malignant neoplasm of prostate: Secondary | ICD-10-CM

## 2019-08-01 DIAGNOSIS — I10 Essential (primary) hypertension: Secondary | ICD-10-CM | POA: Diagnosis not present

## 2019-08-01 LAB — HM DIABETES FOOT EXAM

## 2019-08-01 NOTE — Assessment & Plan Note (Signed)
BP Readings from Last 3 Encounters:  08/01/19 110/70  07/07/19 122/72  05/31/19 116/80   Good control On ACEI

## 2019-08-01 NOTE — Assessment & Plan Note (Addendum)
Seems to still have good control ?very early neuropathy Due for eye exam---he will set up

## 2019-08-01 NOTE — Assessment & Plan Note (Signed)
Healthy Needs to get back to more exercise Had flu vaccine Just had colon--due again 2023 Will check PSA after discussion

## 2019-08-01 NOTE — Progress Notes (Signed)
Subjective:    Patient ID: Hunter Richmond, male    DOB: [birth year], 58 y.o.   MRN: [id number]  HPI Here for physical  Having more trouble with his trigger thumb on the right Gets temporary relief with injections Plans to go ahead with surgery soon---Hunter Richmond  Occasional pain in left side/flank Intermittent--worries about his kidneys No dysuria or hematuria  Checking sugars 3 times a week Usually okay---- under 120 most of the time No low sugar reactions Some slight tingling in his feet---gets "grainy" feeling on right plantar surface Not doing as much exercise  Work schedule is the same with COVID  Current Outpatient Medications on File Prior to Visit  Medication Sig Dispense Refill  . amLODipine (NORVASC) 10 MG tablet Take 1 tablet by mouth once daily 90 tablet 0  . glucose blood (ONE TOUCH ULTRA TEST) test strip USE TO TEST BLOOD SUGAR ONCE DAILY 100 each 3  . ibuprofen (ADVIL,MOTRIN) 800 MG tablet     . lisinopril-hydrochlorothiazide (ZESTORETIC) 20-25 MG tablet Take 1 tablet by mouth once daily 90 tablet 0  . metFORMIN (GLUCOPHAGE-XR) 500 MG 24 hr tablet TAKE 1 TO 2 TABLETS BY MOUTH ONCE DAILY WITH BREAKFAST 180 tablet 3  . pravastatin (PRAVACHOL) 20 MG tablet TAKE 1 TABLET BY MOUTH ONCE DAILY 90 tablet 3   No current facility-administered medications on file prior to visit.     No Known Allergies  Past Medical History:  Diagnosis Date  . Cataract    forming   . Diabetes mellitus   . GERD (gastroesophageal reflux disease)    no issues for years   . Hyperlipidemia    controlled with pravastatin   . Hypertension    controlled last 2-3 yeras     Past Surgical History:  Procedure Laterality Date  . COLONOSCOPY    . FINGER SURGERY  1979   torn ligament  . POLYPECTOMY    . WISDOM TOOTH EXTRACTION  1989    Family History  Problem Relation Age of Onset  . Diabetes Brother   . Cancer Maternal Aunt        breast cancer  . Cancer Maternal Uncle    throat  . Diabetes Sister   . Heart disease Neg Hx   . Colon cancer Neg Hx   . Esophageal cancer Neg Hx   . Rectal cancer Neg Hx   . Stomach cancer Neg Hx   . Colon polyps Neg Hx     Social History   Socioeconomic History  . Marital status: Married    Spouse name: Not on file  . Number of children: 1  . Years of education: Not on file  . Highest education level: Not on file  Occupational History  . 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 Occupation:    . Occupation:    Social Needs  . Financial resource strain: Not on file  . Food insecurity    Worry: Not on file    Inability: Not on file  . Transportation needs    Medical: Not on file    Non-medical: Not on file  Tobacco Use  . Smoking status: Never Smoker  . Smokeless tobacco: Never Used  Substance and Sexual Activity  . Alcohol use: No  . Drug use: No  . Sexual activity: Not on file  Lifestyle  . Physical activity    Days per week: Not on file    Minutes per session: Not on  file  . Stress: Not on file  Relationships  . Social Herbalist on phone: Not on file    Gets together: Not on file    Attends religious service: Not on file    Active member of club or organization: Not on file    Attends meetings of clubs or organizations: Not on file    Relationship status: Not on file  . Intimate partner violence    Fear of current or ex partner: Not on file    Emotionally abused: Not on file    Physically abused: Not on file    Forced sexual activity: Not on file  Other Topics Concern  . Not on file  Social History Narrative   [employer]-- vegetarian   Review of Systems  Constitutional: Negative for fatigue and unexpected weight change.       Wears seat belt  HENT: Negative for hearing loss, tinnitus and trouble swallowing.        Keeps up with dentist--may need some crowns soon  Eyes: Negative for visual disturbance.       No diplopia or unilateral  vision loss Overdue for eye exam due to COVID  Cardiovascular: Negative for chest pain and palpitations.       Occ mild swelling after a full shift in truck--discussed compression socks  Gastrointestinal: Negative for blood in stool and constipation.       Rare heartburn--tums helped  Endocrine: Negative for polydipsia and polyuria.  Genitourinary: Negative for dysuria and hematuria.       No sexual problems  Musculoskeletal: Negative for arthralgias, back pain and joint swelling.  Skin: Negative for rash.       No ulcers  Allergic/Immunologic: Negative for environmental allergies and immunocompromised state.  Neurological: Negative for dizziness, syncope, light-headedness and headaches.  Hematological: Negative for adenopathy. Does not bruise/bleed easily.  Psychiatric/Behavioral: Negative for dysphoric mood and sleep disturbance. The patient is not nervous/anxious.        Objective:   Physical Exam  Constitutional: He is oriented to person, place, and time. He appears well-developed. No distress.  HENT:  Head: Normocephalic and atraumatic.  Right Ear: External ear normal.  Left Ear: External ear normal.  Mouth/Throat: Oropharynx is clear and moist. No oropharyngeal exudate.  Eyes: Pupils are equal, round, and reactive to light. Conjunctivae are normal.  Neck: No thyromegaly present.  Cardiovascular: Normal rate, regular rhythm, normal heart sounds and intact distal pulses. Exam reveals no gallop.  No murmur heard. Respiratory: Effort normal and breath sounds normal. No respiratory distress. He has no wheezes. He has no rales.  GI: Soft. There is no abdominal tenderness.  Musculoskeletal:        General: No tenderness or edema.  Lymphadenopathy:    He has no cervical adenopathy.  Neurological: He is alert and oriented to person, place, and time.  Normal sensation in plantar feet  Skin: No rash noted. No erythema.  Psychiatric: He has a normal mood and affect. His behavior is  normal.           Assessment & Plan:

## 2019-08-02 LAB — CBC
HCT: 46.9 % (ref 39.0–52.0)
Hemoglobin: 15.6 g/dL (ref 13.0–17.0)
MCHC: 33.2 g/dL (ref 30.0–36.0)
MCV: 93.4 fl (ref 78.0–100.0)
Platelets: 280 10*3/uL (ref 150.0–400.0)
RBC: 5.02 Mil/uL (ref 4.22–5.81)
RDW: 12.9 % (ref 11.5–15.5)
WBC: 6.5 10*3/uL (ref 4.0–10.5)

## 2019-08-02 LAB — COMPREHENSIVE METABOLIC PANEL
ALT: 20 U/L (ref 0–53)
AST: 16 U/L (ref 0–37)
Albumin: 4.8 g/dL (ref 3.5–5.2)
Alkaline Phosphatase: 75 U/L (ref 39–117)
BUN: 10 mg/dL (ref 6–23)
CO2: 31 mEq/L (ref 19–32)
Calcium: 10.2 mg/dL (ref 8.4–10.5)
Chloride: 96 mEq/L (ref 96–112)
Creatinine, Ser: 0.93 mg/dL (ref 0.40–1.50)
GFR: 101.07 mL/min (ref >60.00)
Glucose, Bld: 130 mg/dL — ABNORMAL HIGH (ref 70–99)
Potassium: 4 mEq/L (ref 3.5–5.1)
Sodium: 136 mEq/L (ref 135–145)
Total Bilirubin: 0.7 mg/dL (ref 0.2–1.2)
Total Protein: 7.5 g/dL (ref 6.0–8.3)

## 2019-08-02 LAB — LIPID PANEL
Cholesterol: 164 mg/dL (ref 0–200)
HDL: 41.9 mg/dL (ref >39.00)
LDL Cholesterol: 101 mg/dL — ABNORMAL HIGH (ref 0–99)
NonHDL: 122.57
Total CHOL/HDL Ratio: 4
Triglycerides: 107 mg/dL (ref 0.0–149.0)
VLDL: 21.4 mg/dL (ref 0.0–40.0)

## 2019-08-02 LAB — HEMOGLOBIN A1C: Hgb A1c MFr Bld: 7 % — ABNORMAL HIGH (ref 4.6–6.5)

## 2019-08-02 LAB — PSA: PSA: 0.75 ng/mL (ref 0.10–4.00)

## 2020-01-26 ENCOUNTER — Other Ambulatory Visit: Payer: Self-pay | Admitting: Internal Medicine

## 2020-02-06 ENCOUNTER — Encounter: Payer: Self-pay | Admitting: Internal Medicine

## 2020-02-06 ENCOUNTER — Ambulatory Visit: Payer: BC Managed Care – PPO | Admitting: Internal Medicine

## 2020-02-06 ENCOUNTER — Other Ambulatory Visit: Payer: Self-pay

## 2020-02-06 VITALS — BP 120/80 | HR 69 | Temp 98.2°F | Ht 75.3 in | Wt 230.5 lb

## 2020-02-06 DIAGNOSIS — K219 Gastro-esophageal reflux disease without esophagitis: Secondary | ICD-10-CM

## 2020-02-06 DIAGNOSIS — E1140 Type 2 diabetes mellitus with diabetic neuropathy, unspecified: Secondary | ICD-10-CM

## 2020-02-06 DIAGNOSIS — I10 Essential (primary) hypertension: Secondary | ICD-10-CM | POA: Diagnosis not present

## 2020-02-06 LAB — POCT GLYCOSYLATED HEMOGLOBIN (HGB A1C): Hemoglobin A1C: 9.4 % — AB (ref 4.0–5.6)

## 2020-02-06 MED ORDER — METFORMIN HCL ER 500 MG PO TB24: 1000.0000 mg | ORAL_TABLET | Freq: Two times a day (BID) | ORAL | 0 refills | Status: DC

## 2020-02-06 NOTE — Progress Notes (Signed)
Subjective:    Patient ID: Hunter Richmond, male    DOB: [birth year], 59 y.o.   MRN: [id number]  HPI Here for follow up of diabetes and other chronic health conditions This visit occurred during the SARS-CoV-2 public health emergency.  Safety protocols were in place, including screening questions prior to the visit, additional usage of staff PPE, and extensive cleaning of exam room while observing appropriate contact time as indicated for disinfecting solutions.   Lots of stress ----daughter had COVID vaccine in March (6 days after) Has had heart surgery at birth--then AVR at 8. Has ICD as well. Is on coumadin She had stroke shortly after that Went to [place] for 2 weeks [facility] for inpatient rehab She is finally home for the past 2 weeks  Did lose track of proper eating and exercise Lots of hospital food, etc Not checking sugars lately---it is up some though  Checks BP regularly No problems with this No chest pain or SOB No dizziness or syncope  No heartburn lately No dysphagia  Current Outpatient Medications on File Prior to Visit  Medication Sig Dispense Refill  . amLODipine (NORVASC) 10 MG tablet Take 1 tablet by mouth once daily 90 tablet 3  . glucose blood (ONE TOUCH ULTRA TEST) test strip USE TO TEST BLOOD SUGAR ONCE DAILY 100 each 3  . ibuprofen (ADVIL,MOTRIN) 800 MG tablet     . lisinopril-hydrochlorothiazide (ZESTORETIC) 20-25 MG tablet Take 1 tablet by mouth once daily 90 tablet 3  . metFORMIN (GLUCOPHAGE-XR) 500 MG 24 hr tablet TAKE 1 TO 2 TABLETS BY MOUTH ONCE DAILY WITH BREAKFAST 180 tablet 3  . pravastatin (PRAVACHOL) 20 MG tablet Take 1 tablet by mouth once daily 90 tablet 3   No current facility-administered medications on file prior to visit.    No Known Allergies  Past Medical History:  Diagnosis Date  . Cataract    forming   . Diabetes mellitus   . GERD (gastroesophageal reflux disease)    no issues for years   .  Hyperlipidemia    controlled with pravastatin   . Hypertension    controlled last 2-3 yeras     Past Surgical History:  Procedure Laterality Date  . COLONOSCOPY    . FINGER SURGERY  1979   torn ligament  . POLYPECTOMY    . WISDOM TOOTH EXTRACTION  1989    Family History  Problem Relation Age of Onset  . Diabetes Brother   . Cancer Maternal Aunt        breast cancer  . Cancer Maternal Uncle        throat  . Diabetes Sister   . Heart disease Neg Hx   . Colon cancer Neg Hx   . Esophageal cancer Neg Hx   . Rectal cancer Neg Hx   . Stomach cancer Neg Hx   . Colon polyps Neg Hx     Social History   Socioeconomic History  . Marital status: Married    Spouse name: Not on file  . Number of children: 1  . Years of education: Not on file  . Highest education level: Not on file  Occupational History  . 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 Occupation:    . Occupation:    Tobacco Use  . Smoking status: Never Smoker  . Smokeless tobacco: Never Used  Substance and Sexual Activity  . Alcohol use: No  . Drug use: No  .  Sexual activity: Not on file  Other Topics Concern  . Not on file  Social History Narrative   [employer]-- vegetarian   Social Determinants of Health   Financial Resource Strain:   . Difficulty of Paying Living Expenses:   Food Insecurity:   . Worried About Charity fundraiser in the Last Year:   . Arboriculturist in the Last Year:   Transportation Needs:   . Film/video editor (Medical):   Marland Kitchen Lack of Transportation (Non-Medical):   Physical Activity:   . Days of Exercise per Week:   . Minutes of Exercise per Session:   Stress:   . Feeling of Stress :   Social Connections:   . Frequency of Communication with Friends and Family:   . Frequency of Social Gatherings with Friends and Family:   . Attends Religious Services:   . Active Member of Clubs or Organizations:   . Attends Archivist  Meetings:   Marland Kitchen Marital Status:   Intimate Partner Violence:   . Fear of Current or Ex-Partner:   . Emotionally Abused:   Marland Kitchen Physically Abused:   . Sexually Abused:    Review of Systems Weight is stable Not sleeping great Work has been "chaotic"    Objective:   Physical Exam  Constitutional: He appears well-developed. No distress.  Neck: No thyromegaly present.  Cardiovascular: Normal rate, regular rhythm, normal heart sounds and intact distal pulses. Exam reveals no gallop.  No murmur heard. Respiratory: Effort normal and breath sounds normal. No respiratory distress. He has no wheezes. He has no rales.  GI: Soft. There is no abdominal tenderness.  Musculoskeletal:        General: No edema.  Lymphadenopathy:    He has no cervical adenopathy.  Skin:  No foot lesions           Assessment & Plan:

## 2020-02-06 NOTE — Assessment & Plan Note (Signed)
Not worsened despite his lifestyle change

## 2020-02-06 NOTE — Assessment & Plan Note (Signed)
Lab Results  Component Value Date   HGBA1C 9.4 (A) 02/06/2020   Marked worsening --but expected with severe lifestyle change and stress Eating hospital food, not exercising, etc  Will double the metformin Recheck 3 months---add new medication if still up (?dapagaflozin)

## 2020-02-06 NOTE — Patient Instructions (Signed)
Please increase the metformin to 1000mg  twice a day. Let me know if you have any troubles with this. Check your sugars regularly in the mornings now

## 2020-02-06 NOTE — Assessment & Plan Note (Signed)
BP Readings from Last 3 Encounters:  02/06/20 120/80  08/01/19 110/70  07/07/19 122/72   Good control still No change in ACEI/HCTZ and amlodipine

## 2020-05-10 ENCOUNTER — Ambulatory Visit: Payer: BC Managed Care – PPO | Admitting: Internal Medicine

## 2020-05-11 ENCOUNTER — Ambulatory Visit: Payer: BC Managed Care – PPO | Admitting: Internal Medicine

## 2020-05-11 ENCOUNTER — Other Ambulatory Visit: Payer: Self-pay

## 2020-05-11 ENCOUNTER — Encounter: Payer: Self-pay | Admitting: Internal Medicine

## 2020-05-11 VITALS — BP 128/74 | HR 64 | Ht 76.0 in | Wt 229.0 lb

## 2020-05-11 DIAGNOSIS — E1140 Type 2 diabetes mellitus with diabetic neuropathy, unspecified: Secondary | ICD-10-CM

## 2020-05-11 LAB — POCT GLYCOSYLATED HEMOGLOBIN (HGB A1C): Hemoglobin A1C: 7.8 % — AB (ref 4.0–5.6)

## 2020-05-11 NOTE — Assessment & Plan Note (Addendum)
Seems to have better control No serious neuropathy pain Will recheck A1c  Lab Results  Component Value Date   HGBA1C 7.8 (A) 05/11/2020   Back under 8% so will hold off on other medications

## 2020-05-11 NOTE — Progress Notes (Signed)
Subjective:    Patient ID: Hunter Richmond, male    DOB: Jan 15, 1961, 59 y.o.   MRN: [id number]  HPI Here for follow up of uncontrolled diabetes This visit occurred during the SARS-CoV-2 public health emergency.  Safety protocols were in place, including screening questions prior to the visit, additional usage of staff PPE, and extensive cleaning of exam room while observing appropriate contact time as indicated for disinfecting solutions.   Daughter is better Hoping for full recovery now  He has improved lifestyle Back to eating at home mostly Avoids meat--but some chicken and fish now (no longer vegetarian) Back to exercise--some at least  Checks sugars every other day  No problems with increased metformin Post prandial sugar 160 No recent fastings--had been 130  Current Outpatient Medications on File Prior to Visit  Medication Sig Dispense Refill  . amLODipine (NORVASC) 10 MG tablet Take 1 tablet by mouth once daily 90 tablet 3  . glucose blood (ONE TOUCH ULTRA TEST) test strip USE TO TEST BLOOD SUGAR ONCE DAILY 100 each 3  . ibuprofen (ADVIL,MOTRIN) 800 MG tablet     . lisinopril-hydrochlorothiazide (ZESTORETIC) 20-25 MG tablet Take 1 tablet by mouth once daily 90 tablet 3  . metFORMIN (GLUCOPHAGE-XR) 500 MG 24 hr tablet Take 2 tablets (1,000 mg total) by mouth in the morning and at bedtime. 1 tablet 0  . pravastatin (PRAVACHOL) 20 MG tablet Take 1 tablet by mouth once daily 90 tablet 3   No current facility-administered medications on file prior to visit.    No Known Allergies  Past Medical History:  Diagnosis Date  . Cataract    forming   . Diabetes mellitus   . GERD (gastroesophageal reflux disease)    no issues for years   . Hyperlipidemia    controlled with pravastatin   . Hypertension    controlled last 2-3 yeras     Past Surgical History:  Procedure Laterality Date  . COLONOSCOPY    . FINGER SURGERY  1979   torn ligament  . POLYPECTOMY    . WISDOM TOOTH  EXTRACTION  1989    Family History  Problem Relation Age of Onset  . Diabetes Brother   . Cancer Maternal Aunt        breast cancer  . Cancer Maternal Uncle        throat  . Diabetes Sister   . Heart disease Neg Hx   . Colon cancer Neg Hx   . Esophageal cancer Neg Hx   . Rectal cancer Neg Hx   . Stomach cancer Neg Hx   . Colon polyps Neg Hx     Social History   Socioeconomic History  . Marital status: Married    Spouse name: Not on file  . Number of children: 1  . Years of education: Not on file  . Highest education level: Not on file  Occupational History  . 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 Occupation:    . Occupation:    Tobacco Use  . Smoking status: Never Smoker  . Smokeless tobacco: Never Used  Vaping Use  . Vaping Use: Never used  Substance and Sexual Activity  . Alcohol use: No  . Drug use: No  . Sexual activity: Not on file  Other Topics Concern  . Not on file  Social History Narrative   [employer]-- vegetarian   Social Determinants of Health   Financial Resource Strain:   .  Difficulty of Paying Living Expenses: Not on file  Food Insecurity:   . Worried About Charity fundraiser in the Last Year: Not on file  . Ran Out of Food in the Last Year: Not on file  Transportation Needs:   . Lack of Transportation (Medical): Not on file  . Lack of Transportation (Non-Medical): Not on file  Physical Activity:   . Days of Exercise per Week: Not on file  . Minutes of Exercise per Session: Not on file  Stress:   . Feeling of Stress : Not on file  Social Connections:   . Frequency of Communication with Friends and Family: Not on file  . Frequency of Social Gatherings with Friends and Family: Not on file  . Attends Religious Services: Not on file  . Active Member of Clubs or Organizations: Not on file  . Attends Archivist Meetings: Not on file  . Marital Status: Not on file  Intimate Partner  Violence:   . Fear of Current or Ex-Partner: Not on file  . Emotionally Abused: Not on file  . Physically Abused: Not on file  . Sexually Abused: Not on file   Review of Systems  Sleeps okay Weight stable     Objective:   Physical Exam Constitutional:      Appearance: Normal appearance.  Cardiovascular:     Rate and Rhythm: Normal rate and regular rhythm.     Pulses: Normal pulses.     Heart sounds: No murmur heard.  No gallop.   Pulmonary:     Effort: Pulmonary effort is normal.     Breath sounds: Normal breath sounds. No wheezing or rales.  Musculoskeletal:     Right lower leg: No edema.     Left lower leg: No edema.  Skin:    Comments: No foot lesions  Neurological:     Mental Status: He is alert.            Assessment & Plan:

## 2020-08-03 ENCOUNTER — Other Ambulatory Visit: Payer: Self-pay | Admitting: Internal Medicine

## 2020-09-07 ENCOUNTER — Other Ambulatory Visit: Payer: Self-pay | Admitting: Internal Medicine

## 2020-11-13 ENCOUNTER — Ambulatory Visit: Payer: BC Managed Care – PPO | Admitting: Internal Medicine

## 2020-11-14 ENCOUNTER — Encounter: Payer: Self-pay | Admitting: Internal Medicine

## 2020-11-14 ENCOUNTER — Ambulatory Visit: Payer: BC Managed Care – PPO | Admitting: Internal Medicine

## 2020-11-14 ENCOUNTER — Other Ambulatory Visit: Payer: Self-pay

## 2020-11-14 VITALS — BP 116/78 | HR 64 | Temp 97.5°F | Ht 76.0 in | Wt 230.0 lb

## 2020-11-14 DIAGNOSIS — I10 Essential (primary) hypertension: Secondary | ICD-10-CM

## 2020-11-14 DIAGNOSIS — E1140 Type 2 diabetes mellitus with diabetic neuropathy, unspecified: Secondary | ICD-10-CM

## 2020-11-14 LAB — POCT GLYCOSYLATED HEMOGLOBIN (HGB A1C): Hemoglobin A1C: 7.1 % — AB (ref 4.0–5.6)

## 2020-11-14 LAB — HM DIABETES FOOT EXAM

## 2020-11-14 NOTE — Assessment & Plan Note (Signed)
Lab Results  Component Value Date   HGBA1C 7.1 (A) 11/14/2020   Good control on metformin Counseled him about the safety of that Very minimal neuropathy

## 2020-11-14 NOTE — Progress Notes (Signed)
Subjective:    Patient ID: Hunter Richmond, male    DOB: [birth year], 60 y.o.   MRN: [id number]  HPI Here for follow up of diabetes and other chronic health conditions This visit occurred during the SARS-CoV-2 public health emergency.  Safety protocols were in place, including screening questions prior to the visit, additional usage of staff PPE, and extensive cleaning of exam room while observing appropriate contact time as indicated for disinfecting solutions.   He has heard concerns about long term safety of metformin People blaming amputations on it, etc  Right great toe---feels a little pinch briefly Usually transient Mild but concerned   Checks sugars twice a week 110-130 mostly No hypoglycemic reactions Has fallen off a bit with exericse  No chest pain No SOB No dizziness or syncope  Current Outpatient Medications on File Prior to Visit  Medication Sig Dispense Refill  . amLODipine (NORVASC) 10 MG tablet Take 1 tablet by mouth once daily 90 tablet 0  . glucose blood (ONE TOUCH ULTRA TEST) test strip USE TO TEST BLOOD SUGAR ONCE DAILY 100 each 3  . ibuprofen (ADVIL,MOTRIN) 800 MG tablet     . lisinopril-hydrochlorothiazide (ZESTORETIC) 20-25 MG tablet Take 1 tablet by mouth once daily 90 tablet 0  . metFORMIN (GLUCOPHAGE-XR) 500 MG 24 hr tablet TAKE 1 TO 2 TABLETS BY MOUTH ONCE DAILY WITH BREAKFAST 180 tablet 3  . pravastatin (PRAVACHOL) 20 MG tablet Take 1 tablet (20 mg total) by mouth daily. 90 tablet 0   No current facility-administered medications on file prior to visit.    No Known Allergies  Past Medical History:  Diagnosis Date  . Cataract    forming   . Diabetes mellitus   . GERD (gastroesophageal reflux disease)    no issues for years   . Hyperlipidemia    controlled with pravastatin   . Hypertension    controlled last 2-3 yeras     Past Surgical History:  Procedure Laterality Date  . COLONOSCOPY    . FINGER SURGERY  1979   torn ligament  .  POLYPECTOMY    . WISDOM TOOTH EXTRACTION  1989    Family History  Problem Relation Age of Onset  . Diabetes Brother   . Cancer Maternal Aunt        breast cancer  . Cancer Maternal Uncle        throat  . Diabetes Sister   . Heart disease Neg Hx   . Colon cancer Neg Hx   . Esophageal cancer Neg Hx   . Rectal cancer Neg Hx   . Stomach cancer Neg Hx   . Colon polyps Neg Hx     Social History   Socioeconomic History  . Marital status: Married    Spouse name: Not on file  . Number of children: 1  . Years of education: Not on file  . Highest education level: Not on file  Occupational History  . 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 Occupation:    . Occupation:    Tobacco Use  . Smoking status: Never Smoker  . Smokeless tobacco: Never Used  Vaping Use  . Vaping Use: Never used  Substance and Sexual Activity  . Alcohol use: No  . Drug use: No  . Sexual activity: Not on file  Other Topics Concern  . Not on file  Social History Narrative   S[employer]-- vegetarian   Social Determinants of Health  Financial Resource Strain: Not on file  Food Insecurity: Not on file  Transportation Needs: Not on file  Physical Activity: Not on file  Stress: Not on file  Social Connections: Not on file  Intimate Partner Violence: Not on file   Review of Systems Weight is stable though Sleeps well    Objective:   Physical Exam Constitutional:      Appearance: Normal appearance.  Cardiovascular:     Rate and Rhythm: Normal rate and regular rhythm.     Pulses: Normal pulses.     Heart sounds: No murmur heard. No gallop.   Pulmonary:     Effort: Pulmonary effort is normal.     Breath sounds: Normal breath sounds. No wheezing or rales.  Musculoskeletal:     Cervical back: Neck supple.     Right lower leg: No edema.     Left lower leg: No edema.  Lymphadenopathy:     Cervical: No cervical adenopathy.  Skin:    Comments: No foot  lesions  Neurological:     Mental Status: He is alert.     Comments: Fairly normal sensation in feet  Psychiatric:        Mood and Affect: Mood normal.        Behavior: Behavior normal.            Assessment & Plan:

## 2020-11-14 NOTE — Assessment & Plan Note (Signed)
BP Readings from Last 3 Encounters:  11/14/20 116/78  05/11/20 128/74  02/06/20 120/80   Good control on lisinopril HCTZ and amlodipine

## 2020-11-26 ENCOUNTER — Other Ambulatory Visit: Payer: Self-pay | Admitting: Internal Medicine

## 2021-03-06 ENCOUNTER — Other Ambulatory Visit: Payer: Self-pay

## 2021-03-06 ENCOUNTER — Encounter (HOSPITAL_COMMUNITY): Payer: Self-pay | Admitting: Emergency Medicine

## 2021-03-06 ENCOUNTER — Ambulatory Visit (HOSPITAL_COMMUNITY)
Admission: EM | Admit: 2021-03-06 | Discharge: 2021-03-06 | Disposition: A | Discharge status: Home / Self Care | Payer: BC Managed Care – PPO

## 2021-03-06 NOTE — ED Triage Notes (Signed)
Reports ingrown toenail to left great toe.  History of the same

## 2021-03-08 ENCOUNTER — Ambulatory Visit: Payer: BC Managed Care – PPO | Admitting: Primary Care

## 2021-03-08 ENCOUNTER — Encounter: Payer: Self-pay | Admitting: Primary Care

## 2021-03-08 ENCOUNTER — Telehealth: Payer: Self-pay | Admitting: Internal Medicine

## 2021-03-08 ENCOUNTER — Other Ambulatory Visit: Payer: Self-pay

## 2021-03-08 VITALS — BP 118/74 | HR 82 | Temp 98.4°F | Ht 76.0 in | Wt 239.0 lb

## 2021-03-08 DIAGNOSIS — L600 Ingrowing nail: Secondary | ICD-10-CM

## 2021-03-08 NOTE — Telephone Encounter (Signed)
Patient was seen today by Anda Kraft for an ingrown toenail that started x 7 days ago. Pt was seen by [facility] on Wednesday and they did not treat him. Anda Kraft saw him today and referred him to [facility] [place]   Pt is scheduled for 03/14/21 at 2:30 with Dr Milinda Pointer - pt did not want to go to [facility] which limited the time frame that we were able to get him scheduled.   Anda Kraft wanted him seen sooner than 03/14/21 - pt still does not want to go to [place].  Patient is now requesting to be seen by Dr Silvio Pate next week - states Dr Silvio Pate has treated this in the past. I advised that he is out this week and that I would send him a message.  Dr Silvio Pate,  Do you feel the patient needs to be seen by [facility] [place] at scheduled appt or do you want to work him in early next week to treat?

## 2021-03-08 NOTE — Progress Notes (Signed)
Subjective:    Patient ID: Hunter Richmond, male    DOB: 12/17/1960, 60 y.o.   MRN: [id number]  HPI  Hunter Richmond is a very pleasant 60 y.o. male patient of Dr. Silvio Pate with a history of type 2 diabetes, hypertension who presents today to discuss toe pain.   His pain is located to the the left great toe at the medial nail border which he first noticed about 7 days ago. He's had this occur one year ago, had to have nail cut down by his PCP which was very painful.   He's noticed some drainage and mild redness. Has been soaking in Epsom salt. Denies fevers. Home glucose readings are ranging 100-120.   Review of Systems  Constitutional:  Negative for fever.  Skin:  Negative for color change.        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   Occupation:     Occupation:    Tobacco Use   Smoking status: Never   Smokeless tobacco: Never  Vaping Use   Vaping Use: Never used  Substance and Sexual Activity   Alcohol use: No   Drug use: No   Sexual activity: Not on file  Other Topics Concern   Not on file  Social History Narrative   S[employer]-- vegetarian   Social Determinants of Health   Financial Resource Strain: Not on file  Food Insecurity: Not on file  Transportation Needs: Not on file  Physical Activity: Not on file  Stress: Not on file  Social Connections: Not on file  Intimate Partner Violence: Not on file    Past Surgical History:  Procedure Laterality Date    COLONOSCOPY     FINGER SURGERY  1979   torn ligament   POLYPECTOMY     WISDOM TOOTH EXTRACTION  1989    Family History  Problem Relation Age of Onset   Diabetes Brother    Cancer Maternal Aunt        breast cancer   Cancer Maternal Uncle        throat   Diabetes Sister    Heart disease Neg Hx    Colon cancer Neg Hx    Esophageal cancer Neg Hx    Rectal cancer Neg Hx    Stomach cancer Neg Hx    Colon polyps Neg Hx     No Known Allergies  Current Outpatient Medications on File Prior to Visit  Medication Sig Dispense Refill   amLODipine (NORVASC) 10 MG tablet Take 1 tablet by mouth once daily 90 tablet 3   glucose blood (ONE TOUCH ULTRA TEST) test strip USE TO TEST BLOOD SUGAR ONCE DAILY 100 each 3   ibuprofen (ADVIL,MOTRIN) 800 MG tablet      lisinopril-hydrochlorothiazide (ZESTORETIC) 20-25 MG tablet Take 1 tablet by mouth once daily 90 tablet 3   metFORMIN (GLUCOPHAGE-XR)  500 MG 24 hr tablet TAKE 1 TO 2 TABLETS BY MOUTH ONCE DAILY WITH BREAKFAST 180 tablet 3   pravastatin (PRAVACHOL) 20 MG tablet Take 1 tablet by mouth once daily 90 tablet 3   No current facility-administered medications on file prior to visit.    BP 118/74   Pulse 82   Temp 98.4 F (36.9 C) (Temporal)   Ht 6\' 4"  (1.93 m)   Wt 239 lb (108.4 kg)   SpO2 96%   BMI 29.09 kg/m  Objective:   Physical Exam Constitutional:      Appearance: He is not ill-appearing.  Skin:    General: Skin is dry.     Findings: No erythema.     Comments: Mild swelling with clear drainage to left medial great toenail bed.  Mild tenderness upon palpation.            Assessment & Plan:      This visit occurred during the SARS-CoV-2 public health emergency.  Safety protocols were in place, including screening questions prior to the visit, additional usage of staff PPE, and extensive cleaning of exam room while observing appropriate contact time as indicated for disinfecting solutions.

## 2021-03-08 NOTE — Patient Instructions (Signed)
Please meet with with either Ashtyn or Anastasiya regarding your referral to podiatry.  It was a pleasure to see you today!

## 2021-03-08 NOTE — Assessment & Plan Note (Addendum)
Stable without evidence for infection. A1C under great control at 7.1. No ulcerations.   I do not feel comfortable cutting this back today given the appearance, neither do any of the other providers in the office.   We placed a referral to podiatry for evaluation early next week.

## 2021-03-09 NOTE — Telephone Encounter (Signed)
I do treat ingrown toenails. It is not much sooner, but you can add him on Monday at 1:30PM if he wants to do that.  [place], Just let me know if he is coming.

## 2021-03-11 NOTE — Telephone Encounter (Signed)
Spoke to pt. He said he has decided it may be time to see a podiatrist about it and will wait for that appt.

## 2021-03-14 ENCOUNTER — Other Ambulatory Visit: Payer: Self-pay

## 2021-03-14 ENCOUNTER — Encounter: Payer: Self-pay | Admitting: Podiatry

## 2021-03-14 ENCOUNTER — Ambulatory Visit: Payer: BC Managed Care – PPO | Admitting: Podiatry

## 2021-03-14 DIAGNOSIS — L600 Ingrowing nail: Secondary | ICD-10-CM

## 2021-03-14 MED ORDER — NEOMYCIN-POLYMYXIN-HC 1 % OT SOLN: OTIC | 1 refills | Status: DC

## 2021-03-14 NOTE — Patient Instructions (Signed)

## 2021-03-14 NOTE — Progress Notes (Signed)
Subjective:  Patient ID: Hunter Richmond, male    DOB: 1961/01/16,  MRN: [id number] HPI Chief Complaint  Patient presents with   Toe Pain    Hallux left - medial border - tender, swollen, draining x few weeks, soaking in epsom, peroxide cleanse, bandaid   New Patient (Initial Visit)    60 y.o. male presents with the above complaint.   ROS: He denies fever chills nausea vomiting muscle aches pains calf pain back pain chest pain shortness of breath.  States that Dr. Silvio Pate has tried to remove this before but it continues to bother him.  He states that he has been using peroxide soaking and trimming the nail border to help relieve it.  Past Medical History:  Diagnosis Date   Cataract    forming    Diabetes mellitus    GERD (gastroesophageal reflux disease)    no issues for years    Hyperlipidemia    controlled with pravastatin    Hypertension    controlled last 2-3 yeras    Past Surgical History:  Procedure Laterality Date   COLONOSCOPY     FINGER SURGERY  1979   torn ligament   POLYPECTOMY     WISDOM TOOTH EXTRACTION  1989    Current Outpatient Medications:    amLODipine (NORVASC) 10 MG tablet, Take 1 tablet by mouth once daily, Disp: 90 tablet, Rfl: 3   glucose blood (ONE TOUCH ULTRA TEST) test strip, USE TO TEST BLOOD SUGAR ONCE DAILY, Disp: 100 each, Rfl: 3   ibuprofen (ADVIL,MOTRIN) 800 MG tablet, , Disp: , Rfl:    lisinopril-hydrochlorothiazide (ZESTORETIC) 20-25 MG tablet, Take 1 tablet by mouth once daily, Disp: 90 tablet, Rfl: 3   metFORMIN (GLUCOPHAGE-XR) 500 MG 24 hr tablet, TAKE 1 TO 2 TABLETS BY MOUTH ONCE DAILY WITH BREAKFAST (Patient taking differently: 2-4 tab daily), Disp: 180 tablet, Rfl: 3   pravastatin (PRAVACHOL) 20 MG tablet, Take 1 tablet by mouth once daily, Disp: 90 tablet, Rfl: 3  No Known Allergies Review of Systems Objective:  There were no vitals filed for this visit.  General: Well developed, nourished, in no acute distress, alert and  oriented x3   Dermatological: Skin is warm, dry and supple bilateral. Nails x 10 are well maintained; remaining integument appears unremarkable at this time. There are no open sores, no preulcerative lesions, no rash or signs of infection present.  There is granulation tissue along the medial nail fold of the hallux left with white macerated tissue surrounding it.  No purulence no malodor but tenderness on palpation.  Vascular: Dorsalis Pedis artery and Posterior Tibial artery pedal pulses are 2/4 bilateral with immedate capillary fill time. Pedal hair growth present. No varicosities and no lower extremity edema present bilateral.   Neruologic: Grossly intact via light touch bilateral. Vibratory intact via tuning fork bilateral. Protective threshold with Semmes Wienstein monofilament intact to all pedal sites bilateral. Patellar and Achilles deep tendon reflexes 2+ bilateral. No Babinski or clonus noted bilateral.   Musculoskeletal: No gross boney pedal deformities bilateral. No pain, crepitus, or limitation noted with foot and ankle range of motion bilateral. Muscular strength 5/5 in all groups tested bilateral.  Gait: Unassisted, Nonantalgic.    Radiographs:  None taken  Assessment & Plan:   Assessment: Ingrown toenail tibial border hallux left  Plan: Discussed etiology pathology conservative versus surgical therapies at this point chemical matricectomy was performed.  He tolerated procedure well after local anesthetic was administered.  He was given both oral  and written home-going instruction for the care and soaking of the toe.  He also received a prescription for Cortisporin Otic to be applied to the toe after soaking twice daily.  I will follow-up with him in a couple of weeks to make sure he is doing well.  Should he have questions or concerns he will notify us immediately.     Habeeb Puertas T. [place], [place]

## 2021-03-28 ENCOUNTER — Ambulatory Visit: Payer: BC Managed Care – PPO | Admitting: Podiatry

## 2021-03-28 ENCOUNTER — Other Ambulatory Visit: Payer: Self-pay

## 2021-03-28 DIAGNOSIS — L600 Ingrowing nail: Secondary | ICD-10-CM

## 2021-03-28 NOTE — Progress Notes (Signed)
  Subjective:  Patient ID: Hunter Richmond, male    DOB: 10/17/1960,  MRN: [id number]  Chief Complaint  Patient presents with   Ingrown Toenail    2 week nail check left    60 y.o. male presents with the above complaint. History confirmed with patient.   Objective:  Physical Exam: Foot is warm and well-perfused with palpable pulses his matricectomy site in the left medial hallux is healing well Assessment:   1. Ingrowing left great toenail      Plan:  Patient was evaluated and treated and all questions answered.  Healing well 2 weeks out from left medial hallux matricectomy, discontinue soaks and ointment this point.  Return as needed for this or other issues  Return if symptoms worsen or fail to improve.

## 2021-04-03 ENCOUNTER — Other Ambulatory Visit: Payer: Self-pay | Admitting: Internal Medicine

## 2021-04-08 ENCOUNTER — Other Ambulatory Visit: Payer: Self-pay | Admitting: Internal Medicine

## 2021-04-09 ENCOUNTER — Telehealth: Payer: Self-pay | Admitting: Internal Medicine

## 2021-04-09 NOTE — Telephone Encounter (Signed)
Already done this morning at request of pharmacy.

## 2021-04-09 NOTE — Telephone Encounter (Signed)
  LAST APPOINTMENT DATE:  Please schedule appointment if longer than 1 year  NEXT APPOINTMENT DATE:'@9'$ /02/2021  MEDICATION: metformin  Is the patient out of medication? yes  PHARMACY: [facility]- [address]  Let patient know to contact pharmacy at the end of the day to make sure medication is ready.  Please notify patient to allow 48-72 hours to process  Encourage patient to contact the pharmacy for refills or they can request refills through [facility]:   LAST REFILL:  QTY:  REFILL DATE:    OTHER COMMENTS:    Okay for refill?  Please advise

## 2021-05-16 ENCOUNTER — Other Ambulatory Visit: Payer: Self-pay

## 2021-05-16 ENCOUNTER — Ambulatory Visit: Payer: BC Managed Care – PPO | Admitting: Internal Medicine

## 2021-05-16 ENCOUNTER — Ambulatory Visit: Payer: BC Managed Care – PPO | Admitting: Family Medicine

## 2021-05-16 ENCOUNTER — Encounter: Payer: Self-pay | Admitting: Internal Medicine

## 2021-05-16 VITALS — BP 124/86 | HR 81 | Temp 98.4°F | Ht 76.0 in | Wt 221.0 lb

## 2021-05-16 DIAGNOSIS — E1165 Type 2 diabetes mellitus with hyperglycemia: Secondary | ICD-10-CM

## 2021-05-16 DIAGNOSIS — E1140 Type 2 diabetes mellitus with diabetic neuropathy, unspecified: Secondary | ICD-10-CM | POA: Diagnosis not present

## 2021-05-16 DIAGNOSIS — IMO0002 Reserved for concepts with insufficient information to code with codable children: Secondary | ICD-10-CM | POA: Insufficient documentation

## 2021-05-16 LAB — POCT GLYCOSYLATED HEMOGLOBIN (HGB A1C): Hemoglobin A1C: 12.6 % — AB (ref 4.0–5.6)

## 2021-05-16 MED ORDER — GLIPIZIDE ER 5 MG PO TB24: 5.0000 mg | ORAL_TABLET | Freq: Every day | ORAL | 3 refills | Status: DC

## 2021-05-16 MED ORDER — GLUCOSE BLOOD VI STRP: ORAL_STRIP | 3 refills | Status: DC

## 2021-05-16 NOTE — Progress Notes (Signed)
Subjective:    Patient ID: Hunter Richmond, male    DOB: 03-03-1961, 60 y.o.   MRN: [id number]  HPI Here for follow up of diabetes This visit occurred during the SARS-CoV-2 public health emergency.  Safety protocols were in place, including screening questions prior to the visit, additional usage of staff PPE, and extensive cleaning of exam room while observing appropriate contact time as indicated for disinfecting solutions.   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 217 before this visit  Current Outpatient Medications on File Prior to Visit  Medication Sig Dispense Refill   amLODipine (NORVASC) 10 MG tablet Take 1 tablet by mouth once daily 90 tablet 3   glucose blood (ONE TOUCH ULTRA TEST) test strip USE TO TEST BLOOD SUGAR ONCE DAILY 100 each 3   ibuprofen (ADVIL,MOTRIN) 800 MG tablet      lisinopril-hydrochlorothiazide (ZESTORETIC) 20-25 MG tablet Take 1 tablet by mouth once daily 90 tablet 3   metFORMIN (GLUCOPHAGE-XR) 500 MG 24 hr tablet TAKE 1 TO 2 TABLETS BY MOUTH ONCE DAILY WITH BREAKFAST 180 tablet 3   NEOMYCIN-POLYMYXIN-HYDROCORTISONE (CORTISPORIN) 1 % SOLN OTIC solution Apply 1-2 drops to toe BID after soaking 10 mL 1   pravastatin (PRAVACHOL) 20 MG tablet Take 1 tablet by mouth once daily 90 tablet 3   No current facility-administered medications on file prior to visit.    No Known Allergies  Past Medical History:  Diagnosis Date   Cataract    forming    Diabetes mellitus    GERD (gastroesophageal reflux disease)    no issues for years    Hyperlipidemia    controlled with pravastatin     Hypertension    controlled last 2-3 yeras     Past Surgical History:  Procedure Laterality Date   COLONOSCOPY     FINGER SURGERY  1979   torn ligament   POLYPECTOMY     WISDOM TOOTH EXTRACTION  1989    Family History  Problem Relation Age of Onset   Diabetes Brother    Cancer Maternal Aunt        breast cancer   Cancer Maternal Uncle        throat   Diabetes Sister    Heart disease Neg Hx    Colon cancer Neg Hx    Esophageal cancer Neg Hx    Rectal cancer Neg Hx    Stomach cancer Neg Hx    Colon polyps Neg Hx     Social History   Socioeconomic History   Marital status: Married    Spouse name: Not on file   Number of children: 1   Years of education: Not on file   Highest education level: Not on file  Occupational History   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   Occupation:     Occupation:    Tobacco Use   Smoking status: Never   Smokeless tobacco: Never  Vaping Use   Vaping Use: Never used  Substance and Sexual Activity   Alcohol use: No   Drug use: No   Sexual activity: Not on file  Other Topics Concern   Not on file  Social History Narrative   [employer]-- vegetarian   Social Determinants of Health   Financial Resource Strain: Not on file  Food Insecurity: Not on file  Transportation Needs: Not on file  Physical Activity: Not on file  Stress: Not on file  Social Connections: Not on file  Intimate Partner Violence: Not on file   Review of Systems Weight down 18#--from hyperglcemia Drinking a lot--and polyuria Has some urgency    Objective:   Physical Exam Constitutional:      Appearance: Normal appearance.  Neurological:     Mental Status: He is alert.  Psychiatric:        Mood and Affect: Mood normal.        Behavior: Behavior normal.           Assessment & Plan:

## 2021-05-16 NOTE — Patient Instructions (Signed)
Continue 2 metformin daily. Start the glipizide '5mg'$  daily with breakfast. Check your sugars fasting every morning---if not under 150 regularly by 1 month, let me know and I will increase the glipizide

## 2021-05-16 NOTE — Assessment & Plan Note (Signed)
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

## 2021-05-17 ENCOUNTER — Telehealth: Payer: Self-pay | Admitting: Internal Medicine

## 2021-05-17 NOTE — Telephone Encounter (Signed)
Hunter Richmond called in wanted to know if Dr. Silvio Pate would write an script for the freestyle libre. He stated his insurance will not cover and he is willing to pay out of pocket for it.

## 2021-05-21 ENCOUNTER — Telehealth: Payer: Self-pay | Admitting: Internal Medicine

## 2021-05-21 ENCOUNTER — Encounter: Payer: BC Managed Care – PPO | Admitting: Internal Medicine

## 2021-05-21 MED ORDER — FREESTYLE LIBRE 2 SENSOR MISC: 1.0000 | 11 refills | Status: DC

## 2021-05-21 NOTE — Telephone Encounter (Signed)
Spoke to pt. This is not something he has discussed with Dr Silvio Pate. I offered to schedule OV. He said he will try something OTC and make OV later.

## 2021-05-21 NOTE — Telephone Encounter (Signed)
Mr. Medica called in and stated that he has not been to sleep for the past couple of weeks. Wanted to know if he can get something called in to [address]

## 2021-05-21 NOTE — Telephone Encounter (Signed)
Spoke to pt. He just needs the sensors. He will use his phone as the reader.

## 2021-05-23 NOTE — Telephone Encounter (Signed)
Mr. Kata called in wanted to know about getting a link so Dr. Silvio Pate can get his reading from his [facility].

## 2021-05-27 NOTE — Telephone Encounter (Signed)
Spoke to pt. He will send the results through [facility].

## 2021-05-28 ENCOUNTER — Telehealth: Payer: Self-pay | Admitting: Internal Medicine

## 2021-05-28 NOTE — Telephone Encounter (Signed)
Called pt to find out what he is needs labs for. Advised he would need an OV to discuss the proper treatment and if bloodwork is the right thing. He said he thinks something is not right. Has a smell with his urine sometimes. No other issues mentioned. I offered an OV 06-03-21. He said he did not want to wait and will go to the hospital to get bloodwork.

## 2021-05-28 NOTE — Telephone Encounter (Signed)
Pt called stating that he would like to come in for lab work bc he is having some health issues,his physical isn't until December

## 2021-05-29 ENCOUNTER — Other Ambulatory Visit: Payer: Self-pay

## 2021-05-29 ENCOUNTER — Emergency Department (HOSPITAL_COMMUNITY): Payer: BC Managed Care – PPO

## 2021-05-29 ENCOUNTER — Emergency Department (HOSPITAL_COMMUNITY)
Admission: EM | Admit: 2021-05-29 | Discharge: 2021-05-29 | Disposition: A | Discharge status: Home / Self Care | Payer: BC Managed Care – PPO | Attending: Emergency Medicine | Admitting: Emergency Medicine

## 2021-05-29 ENCOUNTER — Encounter (HOSPITAL_COMMUNITY): Payer: Self-pay

## 2021-05-29 DIAGNOSIS — R109 Unspecified abdominal pain: Secondary | ICD-10-CM | POA: Insufficient documentation

## 2021-05-29 DIAGNOSIS — I10 Essential (primary) hypertension: Secondary | ICD-10-CM | POA: Diagnosis not present

## 2021-05-29 DIAGNOSIS — M5459 Other low back pain: Secondary | ICD-10-CM | POA: Diagnosis not present

## 2021-05-29 DIAGNOSIS — Z7984 Long term (current) use of oral hypoglycemic drugs: Secondary | ICD-10-CM | POA: Insufficient documentation

## 2021-05-29 DIAGNOSIS — E1140 Type 2 diabetes mellitus with diabetic neuropathy, unspecified: Secondary | ICD-10-CM | POA: Insufficient documentation

## 2021-05-29 DIAGNOSIS — M545 Low back pain, unspecified: Secondary | ICD-10-CM | POA: Insufficient documentation

## 2021-05-29 LAB — URINALYSIS, ROUTINE W REFLEX MICROSCOPIC
Bacteria, UA: NONE SEEN
Bilirubin Urine: NEGATIVE
Glucose, UA: NEGATIVE mg/dL
Hgb urine dipstick: NEGATIVE
Ketones, ur: NEGATIVE mg/dL
Leukocytes,Ua: NEGATIVE
Nitrite: NEGATIVE
Protein, ur: NEGATIVE mg/dL
Specific Gravity, Urine: 1.01 (ref 1.005–1.030)
pH: 6 (ref 5.0–8.0)

## 2021-05-29 MED ORDER — METHOCARBAMOL 750 MG PO TABS: 750.0000 mg | ORAL_TABLET | Freq: Three times a day (TID) | ORAL | 0 refills | Status: DC | PRN

## 2021-05-29 NOTE — Discharge Instructions (Addendum)
It was our pleasure to provide your ER care today - we hope that you feel better.  Your urine tests and xrays look good.   For back pain, take acetaminophen or ibuprofen as need. You may also take robaxin as need for muscle pain/spasm - no driving when taking.   Follow up with primary care doctor in 1-2 weeks.   Return to ER if worse, new symptoms, fevers, new, worsening or severe pain, numbness/weakness, abdominal pain, or other concern.

## 2021-05-29 NOTE — ED Provider Notes (Signed)
[name] DEPT Provider Note   CSN: [id number] Arrival date & time: 05/29/21  1011     History Chief Complaint  Patient presents with   Flank Pain    Hunter Richmond is a 60 y.o. male.  Patient c/o intermittent low back pain, at  times worse on left, and rarely/occasionally on right. Symptoms gradual onset, dull, moderate, comes and goes. Denies specific injury or strain. No radicular pain. No saddle area or leg numbness. No weakness. No hx ddd. No hx kidney stones. No hematuria or dysuria. Pt did note mild odor to urine. No fever or chills. Pt indicates symptoms/pain currently improved.   The history is provided by the patient.  Flank Pain Pertinent negatives include no chest pain, no abdominal pain, no headaches and no shortness of breath.      Past Medical History:  Diagnosis Date   Cataract    forming    Diabetes mellitus    GERD (gastroesophageal reflux disease)    no issues for years    Hyperlipidemia    controlled with pravastatin    Hypertension    controlled last 2-3 yeras     Patient Active Problem List   Diagnosis Date Noted   Uncontrolled diabetes mellitus with diabetic neuropathy ([place]) 05/16/2021   Ingrown toenail 05/31/2019   Shift work sleep disorder 12/23/2017   Edema 02/02/2017   Routine general medical examination at a health care facility 04/14/2012   Hyperlipemia 01/18/2008   Controlled type 2 diabetes mellitus with diabetic neuropathy ([place]) 07/20/2007   Essential hypertension, benign 07/20/2007   GERD 07/20/2007    Past Surgical History:  Procedure Laterality Date   COLONOSCOPY     FINGER SURGERY  1979   torn ligament   POLYPECTOMY     WISDOM TOOTH EXTRACTION  1989       Family History  Problem Relation Age of Onset   Diabetes Brother    Cancer Maternal Aunt        breast cancer   Cancer Maternal Uncle        throat   Diabetes Sister    Heart disease Neg Hx    Colon cancer Neg Hx    Esophageal  cancer Neg Hx    Rectal cancer Neg Hx    Stomach cancer Neg Hx    Colon polyps Neg Hx     Social History   Tobacco Use   Smoking status: Never   Smokeless tobacco: Never  Vaping Use   Vaping Use: Never used  Substance Use Topics   Alcohol use: No   Drug use: No    Home Medications Prior to Admission medications   Medication Sig Start Date End Date Taking? Authorizing Provider  amLODipine (NORVASC) 10 MG tablet Take 1 tablet by mouth once daily Patient taking differently: Take 10 mg by mouth in the morning. 11/26/20  Yes Venia Carbon, MD  BAYER LOW DOSE 81 MG EC tablet Take 81-162 mg by mouth as needed for pain (or headaches). Swallow whole.   Yes [provider]  Continuous Blood Gluc Sensor (FREESTYLE LIBRE 2 SENSOR) MISC 1 each by Does not apply route every 14 (fourteen) days. Patient taking differently: Inject 1 Device into the skin every 14 (fourteen) days. 05/21/21  Yes Venia Carbon, MD  glipiZIDE (GLUCOTROL XL) 5 MG 24 hr tablet Take 1 tablet (5 mg total) by mouth daily with breakfast. 05/16/21  Yes Venia Carbon, MD  methocarbamol (ROBAXIN) 750 MG tablet  Take 1 tablet (750 mg total) by mouth 3 (three) times daily as needed (muscle spasm/pain). 05/29/21  Yes Lajean Saver, MD  glucose blood (ONE TOUCH ULTRA TEST) test strip USE TO TEST BLOOD SUGAR ONCE DAILY 05/16/21   Venia Carbon, MD  lisinopril-hydrochlorothiazide (ZESTORETIC) 20-25 MG tablet Take 1 tablet by mouth once daily 11/26/20   Venia Carbon, MD  metFORMIN (GLUCOPHAGE-XR) 500 MG 24 hr tablet TAKE 1 TO 2 TABLETS BY MOUTH ONCE DAILY WITH BREAKFAST Patient taking differently: Take 1,000 mg by mouth daily with breakfast. 04/09/21   Venia Carbon, MD  NEOMYCIN-POLYMYXIN-HYDROCORTISONE (CORTISPORIN) 1 % SOLN OTIC solution Apply 1-2 drops to toe BID after soaking Patient not taking: Reported on 05/29/2021 03/14/21   Tyson Dense T, DPM  pravastatin (PRAVACHOL) 20 MG tablet Take 1 tablet by mouth once  daily 11/26/20   Venia Carbon, MD    Allergies    Patient has no known allergies.  Review of Systems   Review of Systems  Constitutional:  Negative for chills and fever.  HENT:  Negative for sore throat.   Eyes:  Negative for redness.  Respiratory:  Negative for cough and shortness of breath.   Cardiovascular:  Negative for chest pain.  Gastrointestinal:  Negative for abdominal pain, diarrhea and vomiting.  Genitourinary:  Positive for flank pain. Negative for dysuria.  Musculoskeletal:  Positive for back pain. Negative for neck pain.  Skin:  Negative for rash.  Neurological:  Negative for weakness, numbness and headaches.  Hematological:  Does not bruise/bleed easily.  Psychiatric/Behavioral:  Negative for confusion.    Physical Exam Updated Vital Signs BP 123/85   Pulse 65   Temp 98.4 F (36.9 C) (Oral)   Resp 16   SpO2 99%   Physical Exam Vitals and nursing note reviewed.  Constitutional:      Appearance: Normal appearance. He is well-developed.  HENT:     Head: Atraumatic.     Nose: Nose normal.     Mouth/Throat:     Mouth: Mucous membranes are moist.     Pharynx: Oropharynx is clear.  Eyes:     General: No scleral icterus.    Conjunctiva/sclera: Conjunctivae normal.  Neck:     Trachea: No tracheal deviation.  Cardiovascular:     Rate and Rhythm: Normal rate and regular rhythm.     Pulses: Normal pulses.     Heart sounds: Normal heart sounds. No murmur heard.   No friction rub. No gallop.  Pulmonary:     Effort: Pulmonary effort is normal. No accessory muscle usage or respiratory distress.     Breath sounds: Normal breath sounds.  Abdominal:     General: Bowel sounds are normal. There is no distension.     Palpations: Abdomen is soft. There is no mass.     Tenderness: There is no abdominal tenderness. There is no guarding.  Genitourinary:    Comments: No cva tenderness. Musculoskeletal:        General: No swelling.     Cervical back: Normal range  of motion and neck supple. No rigidity.     Comments: T/L/S spine non tender, aligned, no step off.   Skin:    General: Skin is warm and dry.     Findings: No rash.  Neurological:     Mental Status: He is alert.     Comments: Alert, speech clear. Motor/sens grossly intact bil. Steady gait.   Psychiatric:        Mood and  Affect: Mood normal.    ED Results / Procedures / Treatments   Labs (all labs ordered are listed, but only abnormal results are displayed) Results for orders placed or performed during the hospital encounter of 05/29/21  Urinalysis, Routine w reflex microscopic  Result Value Ref Range   Color, Urine YELLOW YELLOW   APPearance CLEAR CLEAR   Specific Gravity, Urine 1.010 1.005 - 1.030   pH 6.0 5.0 - 8.0   Glucose, UA NEGATIVE NEGATIVE mg/dL   Hgb urine dipstick NEGATIVE NEGATIVE   Bilirubin Urine NEGATIVE NEGATIVE   Ketones, ur NEGATIVE NEGATIVE mg/dL   Protein, ur NEGATIVE NEGATIVE mg/dL   Nitrite NEGATIVE NEGATIVE   Leukocytes,Ua NEGATIVE NEGATIVE   RBC / HPF 0-5 0 - 5 RBC/hpf   WBC, UA 0-5 0 - 5 WBC/hpf   Bacteria, UA NONE SEEN NONE SEEN   DG Lumbar Spine Complete  Result Date: 05/29/2021 CLINICAL DATA:  Bilateral flank pain, dysuria EXAM: LUMBAR SPINE - COMPLETE 4+ VIEW COMPARISON:  None. FINDINGS: There is no evidence of lumbar spine fracture. Alignment is normal. Intervertebral disc spaces are maintained. IMPRESSION: Negative. Electronically Signed   By: Rolm Baptise M.D.   On: 05/29/2021 11:16    EKG None  Radiology DG Lumbar Spine Complete  Result Date: 05/29/2021 CLINICAL DATA:  Bilateral flank pain, dysuria EXAM: LUMBAR SPINE - COMPLETE 4+ VIEW COMPARISON:  None. FINDINGS: There is no evidence of lumbar spine fracture. Alignment is normal. Intervertebral disc spaces are maintained. IMPRESSION: Negative. Electronically Signed   By: Rolm Baptise M.D.   On: 05/29/2021 11:16    Procedures Procedures   Medications Ordered in ED Medications - No data  to display  ED Course  I have reviewed the triage vital signs and the nursing notes.  Pertinent labs & imaging results that were available during my care of the patient were reviewed by me and considered in my medical decision making (see chart for details).    MDM Rules/Calculators/A&P                          Labs and imaging ordered.   Reviewed nursing notes and prior charts for additional history.   Labs reviewed/interpreted by me - ua neg for uti/blood.   XR reviewed/interpreted by me - no acute process.  Rec ibuprofen/acetaminophen prn, and pcp f/u.     Final Clinical Impression(s) / ED Diagnoses Final diagnoses:  Acute left-sided low back pain without sciatica    Rx / DC Orders ED Discharge Orders          Ordered    methocarbamol (ROBAXIN) 750 MG tablet  3 times daily PRN        05/29/21 1454             Lajean Saver, MD 05/29/21 1456

## 2021-05-29 NOTE — ED Triage Notes (Signed)
Pt arrived via POV, c/o bilateral flank pain and dysuria. Endorses pain after urination and urgency/frequency.

## 2021-05-31 NOTE — Telephone Encounter (Signed)
Spoke to pt. He wen to the ER and found that his kidneys are fine. Got the assurance he was looking for.

## 2021-07-02 ENCOUNTER — Encounter: Payer: Self-pay | Admitting: Nurse Practitioner

## 2021-07-02 ENCOUNTER — Telehealth (INDEPENDENT_AMBULATORY_CARE_PROVIDER_SITE_OTHER): Payer: BC Managed Care – PPO | Admitting: Nurse Practitioner

## 2021-07-02 ENCOUNTER — Other Ambulatory Visit: Payer: Self-pay

## 2021-07-02 VITALS — BP 134/89 | HR 83 | Temp 97.5°F

## 2021-07-02 DIAGNOSIS — U071 COVID-19: Secondary | ICD-10-CM | POA: Diagnosis not present

## 2021-07-02 MED ORDER — MOLNUPIRAVIR EUA 200MG CAPSULE: 4.0000 | ORAL_CAPSULE | Freq: Two times a day (BID) | ORAL | 0 refills | Status: AC

## 2021-07-02 NOTE — Assessment & Plan Note (Signed)
Patient was exposed by daughter who is on college campus tours.  Symptoms started yesterday tested positive today patient is vaccinated using over-the-counter regimens.  Did discuss both oral antiviral treatments with patient.  Did discuss they were both emergency use authorized only in the common side effects with the 1 that we chose.  Patient in agreement to continue with treatment.  Did review quarantine guidelines with patient. Start molnupiravir as soon as possible

## 2021-07-02 NOTE — Progress Notes (Signed)
Patient ID: Hunter Richmond, male    DOB: 05/12/1961, 60 y.o.   MRN: [id number]  Virtual visit completed through [employer], a video enabled telemedicine application. Due to national recommendations of social distancing due to COVID-19, a virtual visit is felt to be most appropriate for this patient at this time. Reviewed limitations, risks, security and privacy concerns of performing a virtual visit and the availability of in person appointments. I also reviewed that there may be a patient responsible charge related to this service. The patient agreed to proceed.   Patient location: home Provider location: [place] [facility], office Persons participating in this virtual visit: patient, provider   If any vitals were documented, they were collected by patient at home unless specified below.    BP 134/89   Pulse 83   Temp (!) 97.5 F (36.4 C)    CC:  Covid 19 infection  Subjective:   HPI: Hunter Richmond is a 60 y.o. male presenting on 07/02/2021 for Covid Positive (On 07/02/21, sx started on 07/01/21. Sx are sore throat, feverish feeling, cough, runny nose. Has been taking Tylenol, severe cold and flu OTC generic.)    Symptoms on 07/01/2021 wee hours in  At home test 07/02/2021 Did have exspoure from daughter [place] x2 with a booster Extra strength tylenol, great relief Therau flu.    Relevant past medical, surgical, family and social history reviewed and updated as indicated. Interim medical history since our last visit reviewed. Allergies and medications reviewed and updated. Outpatient Medications Prior to Visit  Medication Sig Dispense Refill   amLODipine (NORVASC) 10 MG tablet Take 1 tablet by mouth once daily (Patient taking differently: Take 10 mg by mouth in the morning.) 90 tablet 3   BAYER LOW DOSE 81 MG EC tablet Take 81-162 mg by mouth as needed for pain (or headaches). Swallow whole.     Continuous Blood Gluc Sensor (FREESTYLE LIBRE 2 SENSOR) MISC 1 each by Does  not apply route every 14 (fourteen) days. (Patient taking differently: Inject 1 Device into the skin every 14 (fourteen) days.) 2 each 11   glipiZIDE (GLUCOTROL XL) 5 MG 24 hr tablet Take 1 tablet (5 mg total) by mouth daily with breakfast. 90 tablet 3   glucose blood (ONE TOUCH ULTRA TEST) test strip USE TO TEST BLOOD SUGAR ONCE DAILY 100 each 3   ibuprofen (ADVIL) 200 MG tablet Take 200-400 mg by mouth every 6 (six) hours as needed for mild pain or headache.     lisinopril-hydrochlorothiazide (ZESTORETIC) 20-25 MG tablet Take 1 tablet by mouth once daily (Patient taking differently: Take 1 tablet by mouth in the morning.) 90 tablet 3   metFORMIN (GLUCOPHAGE-XR) 500 MG 24 hr tablet TAKE 1 TO 2 TABLETS BY MOUTH ONCE DAILY WITH BREAKFAST (Patient taking differently: Take 1,000 mg by mouth daily with breakfast.) 180 tablet 3   methocarbamol (ROBAXIN) 750 MG tablet Take 1 tablet (750 mg total) by mouth 3 (three) times daily as needed (muscle spasm/pain). 15 tablet 0   pravastatin (PRAVACHOL) 20 MG tablet Take 1 tablet by mouth once daily (Patient taking differently: Take 20 mg by mouth daily in the afternoon.) 90 tablet 3   NEOMYCIN-POLYMYXIN-HYDROCORTISONE (CORTISPORIN) 1 % SOLN OTIC solution Apply 1-2 drops to toe BID after soaking (Patient not taking: No sig reported) 10 mL 1   No facility-administered medications prior to visit.     Per HPI unless specifically indicated in ROS section below Review of Systems  Constitutional:  Positive for fever. Negative for chills and fatigue.  HENT:  Positive for rhinorrhea and sore throat. Negative for congestion and postnasal drip.   Respiratory:  Positive for cough. Negative for shortness of breath.   Cardiovascular:  Negative for chest pain.  Gastrointestinal:  Negative for abdominal pain, diarrhea, nausea and vomiting.  Musculoskeletal:  Negative for arthralgias and myalgias.  Neurological:  Negative for headaches.  Objective:  BP 134/89   Pulse 83    Temp (!) 97.5 F (36.4 C)   Wt Readings from Last 3 Encounters:  05/16/21 221 lb (100.2 kg)  03/08/21 239 lb (108.4 kg)  11/14/20 230 lb (104.3 kg)       Physical exam: Gen: alert, NAD, not ill appearing Pulm: speaks in complete sentences without increased work of breathing Psych: normal mood, normal thought content      Results for orders placed or performed during the hospital encounter of 05/29/21  Urinalysis, Routine w reflex microscopic  Result Value Ref Range   Color, Urine YELLOW YELLOW   APPearance CLEAR CLEAR   Specific Gravity, Urine 1.010 1.005 - 1.030   pH 6.0 5.0 - 8.0   Glucose, UA NEGATIVE NEGATIVE mg/dL   Hgb urine dipstick NEGATIVE NEGATIVE   Bilirubin Urine NEGATIVE NEGATIVE   Ketones, ur NEGATIVE NEGATIVE mg/dL   Protein, ur NEGATIVE NEGATIVE mg/dL   Nitrite NEGATIVE NEGATIVE   Leukocytes,Ua NEGATIVE NEGATIVE   RBC / HPF 0-5 0 - 5 RBC/hpf   WBC, UA 0-5 0 - 5 WBC/hpf   Bacteria, UA NONE SEEN NONE SEEN   Assessment & Plan:   Problem List Items Addressed This Visit   None    No orders of the defined types were placed in this encounter.  No orders of the defined types were placed in this encounter.   I discussed the assessment and treatment plan with the patient. The patient was provided an opportunity to ask questions and all were answered. The patient agreed with the plan and demonstrated an understanding of the instructions. The patient was advised to call back or seek an in-person evaluation if the symptoms worsen or if the condition fails to improve as anticipated.  Follow up plan: No follow-ups on file.  Hunter Garret, NP

## 2021-08-23 ENCOUNTER — Encounter: Payer: Self-pay | Admitting: Internal Medicine

## 2021-08-23 ENCOUNTER — Other Ambulatory Visit: Payer: Self-pay

## 2021-08-23 ENCOUNTER — Ambulatory Visit (INDEPENDENT_AMBULATORY_CARE_PROVIDER_SITE_OTHER): Payer: BC Managed Care – PPO | Admitting: Internal Medicine

## 2021-08-23 VITALS — BP 128/82 | HR 71 | Temp 97.8°F | Ht 75.0 in | Wt 206.0 lb

## 2021-08-23 DIAGNOSIS — Z0000 Encounter for general adult medical examination without abnormal findings: Secondary | ICD-10-CM

## 2021-08-23 DIAGNOSIS — E1140 Type 2 diabetes mellitus with diabetic neuropathy, unspecified: Secondary | ICD-10-CM | POA: Diagnosis not present

## 2021-08-23 DIAGNOSIS — I10 Essential (primary) hypertension: Secondary | ICD-10-CM | POA: Diagnosis not present

## 2021-08-23 DIAGNOSIS — Z23 Encounter for immunization: Secondary | ICD-10-CM

## 2021-08-23 DIAGNOSIS — E1159 Type 2 diabetes mellitus with other circulatory complications: Secondary | ICD-10-CM | POA: Diagnosis not present

## 2021-08-23 DIAGNOSIS — Z125 Encounter for screening for malignant neoplasm of prostate: Secondary | ICD-10-CM

## 2021-08-23 DIAGNOSIS — E119 Type 2 diabetes mellitus without complications: Secondary | ICD-10-CM | POA: Insufficient documentation

## 2021-08-23 LAB — COMPREHENSIVE METABOLIC PANEL
ALT: 11 U/L (ref 0–53)
AST: 13 U/L (ref 0–37)
Albumin: 4.7 g/dL (ref 3.5–5.2)
Alkaline Phosphatase: 87 U/L (ref 39–117)
BUN: 12 mg/dL (ref 6–23)
CO2: 30 mEq/L (ref 19–32)
Calcium: 10.3 mg/dL (ref 8.4–10.5)
Chloride: 101 mEq/L (ref 96–112)
Creatinine, Ser: 0.97 mg/dL (ref 0.40–1.50)
GFR: 85.27 mL/min (ref >60.00)
Glucose, Bld: 109 mg/dL — ABNORMAL HIGH (ref 70–99)
Potassium: 3.6 mEq/L (ref 3.5–5.1)
Sodium: 141 mEq/L (ref 135–145)
Total Bilirubin: 0.7 mg/dL (ref 0.2–1.2)
Total Protein: 7.5 g/dL (ref 6.0–8.3)

## 2021-08-23 LAB — CBC
HCT: 45.9 % (ref 39.0–52.0)
Hemoglobin: 15.4 g/dL (ref 13.0–17.0)
MCHC: 33.6 g/dL (ref 30.0–36.0)
MCV: 94.5 fl (ref 78.0–100.0)
Platelets: 253 10*3/uL (ref 150.0–400.0)
RBC: 4.86 Mil/uL (ref 4.22–5.81)
RDW: 12.8 % (ref 11.5–15.5)
WBC: 4.5 10*3/uL (ref 4.0–10.5)

## 2021-08-23 LAB — PSA: PSA: 0.69 ng/mL (ref 0.10–4.00)

## 2021-08-23 LAB — LIPID PANEL
Cholesterol: 142 mg/dL (ref 0–200)
HDL: 44.9 mg/dL (ref >39.00)
LDL Cholesterol: 88 mg/dL (ref 0–99)
NonHDL: 97.27
Total CHOL/HDL Ratio: 3
Triglycerides: 48 mg/dL (ref 0.0–149.0)
VLDL: 9.6 mg/dL (ref 0.0–40.0)

## 2021-08-23 LAB — HM DIABETES FOOT EXAM

## 2021-08-23 LAB — POCT GLYCOSYLATED HEMOGLOBIN (HGB A1C): Hemoglobin A1C: 5.9 % — AB (ref 4.0–5.6)

## 2021-08-23 NOTE — Progress Notes (Signed)
Subjective:    Patient ID: Hunter Richmond, male    DOB: 10-06-1960, 60 y.o.   MRN: [id number]  HPI Here for physical  He has cracked down on his eating--stopped sugar and fried food Has lost a lot of weight Has gotten back to [employer]---using regularly Notes emotional issues with wife (depressed) and kids---this is better  Using the [facility] and that has helped a lot Feet are better--not painful No numbness  Current Outpatient Medications on File Prior to Visit  Medication Sig Dispense Refill   amLODipine (NORVASC) 10 MG tablet Take 1 tablet by mouth once daily (Patient taking differently: Take 10 mg by mouth in the morning.) 90 tablet 3   Continuous Blood Gluc Sensor (FREESTYLE LIBRE 2 SENSOR) MISC 1 each by Does not apply route every 14 (fourteen) days. (Patient taking differently: Inject 1 Device into the skin every 14 (fourteen) days.) 2 each 11   glucose blood (ONE TOUCH ULTRA TEST) test strip USE TO TEST BLOOD SUGAR ONCE DAILY 100 each 3   ibuprofen (ADVIL) 200 MG tablet Take 200-400 mg by mouth every 6 (six) hours as needed for mild pain or headache.     lisinopril-hydrochlorothiazide (ZESTORETIC) 20-25 MG tablet Take 1 tablet by mouth once daily (Patient taking differently: Take 1 tablet by mouth in the morning.) 90 tablet 3   pravastatin (PRAVACHOL) 20 MG tablet Take 1 tablet by mouth once daily (Patient taking differently: Take 20 mg by mouth daily in the afternoon.) 90 tablet 3   BAYER LOW DOSE 81 MG EC tablet Take 81-162 mg by mouth as needed for pain (or headaches). Swallow whole. (Patient not taking: Reported on 08/23/2021)     No current facility-administered medications on file prior to visit.    No Known Allergies  Past Medical History:  Diagnosis Date   Cataract    forming    Diabetes mellitus    GERD (gastroesophageal reflux disease)    no issues for years    Hyperlipidemia    controlled with pravastatin    Hypertension    controlled last 2-3 yeras      Past Surgical History:  Procedure Laterality Date   COLONOSCOPY     FINGER SURGERY  1979   torn ligament   POLYPECTOMY     WISDOM TOOTH EXTRACTION  1989    Family History  Problem Relation Age of Onset   Diabetes Brother    Cancer Maternal Aunt        breast cancer   Cancer Maternal Uncle        throat   Diabetes Sister    Heart disease Neg Hx    Colon cancer Neg Hx    Esophageal cancer Neg Hx    Rectal cancer Neg Hx    Stomach cancer Neg Hx    Colon polyps Neg Hx     Social History   Socioeconomic History   Marital status: Married    Spouse name: Not on file   Number of children: 1   Years of education: Not on file   Highest education level: Not on file  Occupational History   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   Occupation:     Occupation:    Tobacco Use   Smoking status: Never   Smokeless tobacco: Never  Vaping Use   Vaping Use: Never used  Substance and Sexual Activity   Alcohol use: No   Drug use: No   Sexual  activity: Not on file  Other Topics Concern   Not on file  Social History Narrative   [employer]-- vegetarian   Social Determinants of Health   Financial Resource Strain: Not on file  Food Insecurity: Not on file  Transportation Needs: Not on file  Physical Activity: Not on file  Stress: Not on file  Social Connections: Not on file  Intimate Partner Violence: Not on file   Review of Systems  Constitutional:  Negative for fatigue.       Lost 15# in past 3 months Wears seat belt  HENT:  Negative for dental problem, hearing loss and tinnitus.        Keeps up with dentist  Eyes:  Negative for visual disturbance.       Overdue for eye check---no diplopia  Respiratory:  Negative for cough, chest tightness and shortness of breath.   Cardiovascular:  Negative for chest pain and palpitations.       Will occasionally have puffy legs after long driving shift  Gastrointestinal:  Negative for  blood in stool and constipation.       No heartburn  Endocrine: Negative for polydipsia and polyuria.  Genitourinary:  Negative for difficulty urinating and urgency.       No sexual problems  Musculoskeletal:  Negative for arthralgias, back pain and joint swelling.       ER visit for back pain a while back--that has gone away  Skin:  Negative for rash.       No suspicious skin lesions  Allergic/Immunologic: Negative for environmental allergies and immunocompromised state.  Neurological:  Negative for dizziness, syncope, light-headedness and headaches.  Hematological:  Negative for adenopathy. Does not bruise/bleed easily.  Psychiatric/Behavioral:  Negative for dysphoric mood and sleep disturbance. The patient is not nervous/anxious.        Uses melatonin to sleep---drives at night and goes to sleep in afternoon      Objective:   Physical Exam Constitutional:      Appearance: Normal appearance.  HENT:     Right Ear: Tympanic membrane and ear canal normal.     Left Ear: Tympanic membrane and ear canal normal.     Mouth/Throat:     Pharynx: No oropharyngeal exudate or posterior oropharyngeal erythema.  Eyes:     Conjunctiva/sclera: Conjunctivae normal.     Pupils: Pupils are equal, round, and reactive to light.  Cardiovascular:     Rate and Rhythm: Normal rate and regular rhythm.     Pulses: Normal pulses.     Heart sounds: No murmur heard.   No gallop.  Pulmonary:     Effort: Pulmonary effort is normal.     Breath sounds: Normal breath sounds. No wheezing or rales.  Abdominal:     Palpations: Abdomen is soft.     Tenderness: There is no abdominal tenderness.  Musculoskeletal:     Cervical back: Neck supple.     Right lower leg: No edema.     Left lower leg: No edema.  Lymphadenopathy:     Cervical: No cervical adenopathy.  Skin:    Findings: No lesion or rash.     Comments: No foot lesions  Neurological:     General: No focal deficit present.     Mental Status: He is  alert and oriented to person, place, and time.     Comments: Normal sensation in feet  Psychiatric:        Mood and Affect: Mood normal.  Behavior: Behavior normal.           Assessment & Plan:

## 2021-08-23 NOTE — Assessment & Plan Note (Signed)
Lab Results  Component Value Date   HGBA1C 5.9 (A) 08/23/2021   Remarkable improvement from lifestyle Okay to stay off meds but would restart metformin if sugars begin to go up some

## 2021-08-23 NOTE — Assessment & Plan Note (Signed)
BP Readings from Last 3 Encounters:  08/23/21 128/82  07/02/21 134/89  05/29/21 133/88   Good control on lisinopril/HCTZ and amlodipine

## 2021-08-23 NOTE — Assessment & Plan Note (Signed)
Doing well Needs colon again next year Will check PSA after discussion Needs bivalent COVID---had flu vaccine Will give shingrix Exercising regularly

## 2021-08-23 NOTE — Addendum Note (Signed)
Addended by: Pilar Grammes on: 08/23/2021 12:12 PM   Modules accepted: Orders

## 2021-11-07 ENCOUNTER — Telehealth: Payer: Self-pay | Admitting: Internal Medicine

## 2021-11-07 MED ORDER — DEXCOM G7 SENSOR MISC: 1.0000 | 11 refills | Status: DC

## 2021-11-07 MED ORDER — DEXCOM G7 RECEIVER DEVI: 1.0000 | Freq: Once | 0 refills | Status: AC

## 2021-11-07 NOTE — Addendum Note (Signed)
Addended by: Pilar Grammes on: 11/07/2021 04:17 PM   Modules accepted: Orders

## 2021-11-07 NOTE — Telephone Encounter (Signed)
Hunter Richmond called in and wanted to know if a PA could be sent in to his insurance so they can cover the meter.

## 2021-11-07 NOTE — Telephone Encounter (Addendum)
Spoke to pt. He said to see about the Dexcom to [place]. Michela Pitcher it is covered by his insurance.

## 2021-12-21 ENCOUNTER — Other Ambulatory Visit: Payer: Self-pay | Admitting: Internal Medicine

## 2022-01-06 ENCOUNTER — Telehealth: Payer: Self-pay | Admitting: Internal Medicine

## 2022-01-06 NOTE — Telephone Encounter (Signed)
Katy Apo pharmacist calling from [facility] regarding PA for [employer] 2 sensor. [phone] // ref [id number] ?

## 2022-01-17 NOTE — Addendum Note (Signed)
Addended by: Pilar Grammes on: 01/17/2022 11:17 AM ? ? Modules accepted: Orders ? ?

## 2022-01-17 NOTE — Telephone Encounter (Signed)
Did PA over the phone. Will take up to 72 hrs for response to be faxed. They did say the pharmacy is running the rx wrong. Running it as 2 sensors every 14 days. Its 1 sensor every 14 days. ?

## 2022-02-25 ENCOUNTER — Ambulatory Visit: Payer: BC Managed Care – PPO | Admitting: Internal Medicine

## 2022-03-06 ENCOUNTER — Encounter: Payer: Self-pay | Admitting: Internal Medicine

## 2022-03-06 ENCOUNTER — Ambulatory Visit: Payer: BC Managed Care – PPO | Admitting: Internal Medicine

## 2022-03-06 VITALS — BP 114/76 | HR 60 | Temp 97.4°F | Ht 75.0 in | Wt 213.0 lb

## 2022-03-06 DIAGNOSIS — E1159 Type 2 diabetes mellitus with other circulatory complications: Secondary | ICD-10-CM | POA: Diagnosis not present

## 2022-03-06 DIAGNOSIS — E1140 Type 2 diabetes mellitus with diabetic neuropathy, unspecified: Secondary | ICD-10-CM | POA: Diagnosis not present

## 2022-03-06 DIAGNOSIS — I10 Essential (primary) hypertension: Secondary | ICD-10-CM

## 2022-03-06 LAB — POCT GLYCOSYLATED HEMOGLOBIN (HGB A1C): Hemoglobin A1C: 6 % — AB (ref 4.0–5.6)

## 2022-03-06 NOTE — Assessment & Plan Note (Signed)
BP Readings from Last 3 Encounters:  03/06/22 114/76  08/23/21 128/82  07/02/21 134/89   Still with excellent control on amlodipine '10mg'$  and lisinopril/HCTZ 20/25 daily

## 2022-03-06 NOTE — Assessment & Plan Note (Signed)
Lab Results  Component Value Date   HGBA1C 6.0 (A) 03/06/2022   Still with good control without meds The continuous glucose monitor has really helped

## 2022-03-06 NOTE — Progress Notes (Signed)
Subjective:    Patient ID: DOV DILL, male    DOB: 08/31/1961, 61 y.o.   MRN: [id number]  HPI Here for follow up of diabetes  Still doing well with his sugars Freestyle Libre knocked off 3 days ago--but has been monitoring closely 7 day average 143 14 day average 144 30 day 143 90 day 143  Continuing with improved lifestyle---diet and exercise  No chest pain  No SOB  Having stress Brother died in winter Wife hospitalized for a week for suicidal ideation Current Outpatient Medications on File Prior to Visit  Medication Sig Dispense Refill   amLODipine (NORVASC) 10 MG tablet Take 1 tablet by mouth once daily 90 tablet 3   BAYER LOW DOSE 81 MG EC tablet Take 81-162 mg by mouth as needed for pain (or headaches). Swallow whole.     glucose blood (ONE TOUCH ULTRA TEST) test strip USE TO TEST BLOOD SUGAR ONCE DAILY 100 each 3   ibuprofen (ADVIL) 200 MG tablet Take 200-400 mg by mouth every 6 (six) hours as needed for mild pain or headache.     lisinopril-hydrochlorothiazide (ZESTORETIC) 20-25 MG tablet Take 1 tablet by mouth once daily 90 tablet 3   pravastatin (PRAVACHOL) 20 MG tablet Take 1 tablet by mouth once daily 90 tablet 3   No current facility-administered medications on file prior to visit.    No Known Allergies  Past Medical History:  Diagnosis Date   Cataract    forming    Diabetes mellitus    GERD (gastroesophageal reflux disease)    no issues for years    Hyperlipidemia    controlled with pravastatin    Hypertension    controlled last 2-3 yeras     Past Surgical History:  Procedure Laterality Date   COLONOSCOPY     FINGER SURGERY  1979   torn ligament   POLYPECTOMY     WISDOM TOOTH EXTRACTION  1989    Family History  Problem Relation Age of Onset   Diabetes Brother    Cancer Maternal Aunt        breast cancer   Cancer Maternal Uncle        throat   Diabetes Sister    Heart disease Neg Hx    Colon cancer Neg Hx    Esophageal cancer Neg  Hx    Rectal cancer Neg Hx    Stomach cancer Neg Hx    Colon polyps Neg Hx     Social History   Socioeconomic History   Marital status: Married    Spouse name: Not on file   Number of children: 1   Years of education: Not on file   Highest education level: Not on file  Occupational History   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   Occupation:     Occupation:    Tobacco Use   Smoking status: Never    Passive exposure: Never   Smokeless tobacco: Never  Vaping Use   Vaping Use: Never used  Substance and Sexual Activity   Alcohol use: No   Drug use: No   Sexual activity: Not on file  Other Topics Concern   Not on file  Social History Narrative   [employer]-- vegetarian   Social Determinants of Health   Financial Resource Strain: Not on file  Food Insecurity: Not on file  Transportation Needs: Not on file  Physical Activity: Not on file  Stress: Not on file  Social Connections: Not on file  Intimate Partner Violence: Not on file   Review of Systems Did gain back a few pounds since last time (just in the past 1-2 weeks) Sleeps okay     Objective:   Physical Exam Constitutional:      Appearance: Normal appearance.  Cardiovascular:     Rate and Rhythm: Normal rate and regular rhythm.     Pulses: Normal pulses.     Heart sounds: No murmur heard.    No gallop.  Pulmonary:     Effort: Pulmonary effort is normal.     Breath sounds: Normal breath sounds. No wheezing or rales.  Musculoskeletal:     Cervical back: Neck supple.     Right lower leg: No edema.     Left lower leg: No edema.  Lymphadenopathy:     Cervical: No cervical adenopathy.  Neurological:     Mental Status: He is alert.            Assessment & Plan:

## 2022-05-10 ENCOUNTER — Other Ambulatory Visit: Payer: Self-pay | Admitting: Internal Medicine

## 2022-09-13 ENCOUNTER — Other Ambulatory Visit: Payer: Self-pay | Admitting: Internal Medicine

## 2022-09-16 ENCOUNTER — Encounter: Payer: BC Managed Care – PPO | Admitting: Internal Medicine

## 2022-09-30 ENCOUNTER — Encounter: Payer: Self-pay | Admitting: Gastroenterology

## 2022-10-08 ENCOUNTER — Encounter: Payer: Self-pay | Admitting: Gastroenterology

## 2022-10-20 ENCOUNTER — Ambulatory Visit (AMBULATORY_SURGERY_CENTER): Payer: BC Managed Care – PPO

## 2022-10-20 VITALS — Ht 76.0 in | Wt 215.0 lb

## 2022-10-20 DIAGNOSIS — Z86010 Personal history of colonic polyps: Secondary | ICD-10-CM

## 2022-10-20 MED ORDER — NA SULFATE-K SULFATE-MG SULF 17.5-3.13-1.6 GM/177ML PO SOLN: 1.0000 | Freq: Once | ORAL | 0 refills | Status: AC

## 2022-10-20 NOTE — Progress Notes (Signed)
Pre visit completed via phone call; Patient verified name, DOB, and address;  No egg or soy allergy known to patient;  No issues known to pt with past sedation with any surgeries or procedures; Patient denies ever being told they had issues or difficulty with intubation;  No FH of Malignant Hyperthermia; Pt is not on diet pills; Pt is not on home 02  Pt is not on blood thinners;  Pt denies issues with constipation  No A fib or A flutter; Have any cardiac testing pending--NO Pt instructed to use [email or web address] or GoodRx for a price reduction on prep;   Insurance verified during [place] appt=[employer]  Patient's chart reviewed by Osvaldo Angst CNRA prior to previsit and patient appropriate for the [facility].  Previsit completed and red dot placed by patient's name on their procedure day (on provider's schedule).    Patient advised to transfer RX to [facility] and use GoodRx coupon =if price of prep was extremely high

## 2022-10-24 ENCOUNTER — Encounter: Payer: Self-pay | Admitting: Gastroenterology

## 2022-10-27 ENCOUNTER — Encounter: Payer: BC Managed Care – PPO | Admitting: Internal Medicine

## 2022-10-27 ENCOUNTER — Encounter: Payer: Self-pay | Admitting: Internal Medicine

## 2022-10-27 ENCOUNTER — Telehealth (INDEPENDENT_AMBULATORY_CARE_PROVIDER_SITE_OTHER): Payer: BC Managed Care – PPO | Admitting: Internal Medicine

## 2022-10-27 VITALS — Temp 99.9°F | Ht 75.0 in | Wt 212.0 lb

## 2022-10-27 DIAGNOSIS — U071 COVID-19: Secondary | ICD-10-CM

## 2022-10-27 MED ORDER — NIRMATRELVIR/RITONAVIR (PAXLOVID)TABLET: 3.0000 | ORAL_TABLET | Freq: Two times a day (BID) | ORAL | 0 refills | Status: AC

## 2022-10-27 NOTE — Progress Notes (Signed)
Subjective:    Patient ID: Hunter Richmond, male    DOB: Feb 02, 1961, 62 y.o.   MRN: [id number]  HPI Video virtual visit due to COVID infection Identification done Reviewed limitations and billing and he gave consent Participants--patient in his home and I am in my office  Felt slight throat scratching 2.5 days ago 2 days ago---still only mild symptoms No fever then----didn't think much of it Then that night and yesterday morning---temp borderline Did home COVID test---positive  Yesterday---lots of mucus and cough Low grade fever--felt warm Energy levels are okay No headache or ear pain  Using coricidin--seems to help  Current Outpatient Medications on File Prior to Visit  Medication Sig Dispense Refill   amLODipine (NORVASC) 10 MG tablet Take 1 tablet by mouth once daily 90 tablet 3   BAYER LOW DOSE 81 MG EC tablet Take 81-162 mg by mouth as needed for pain (or headaches). Swallow whole.     Continuous Blood Gluc Sensor (FREESTYLE LIBRE 2 SENSOR) MISC Inject 1 Device into the skin every 14 (fourteen) days. 2 each 12   glucose blood (ONETOUCH ULTRA) test strip USE  STRIP TO CHECK GLUCOSE ONCE DAILY 100 each 3   ibuprofen (ADVIL) 200 MG tablet Take 200-400 mg by mouth every 6 (six) hours as needed for mild pain or headache.     lisinopril-hydrochlorothiazide (ZESTORETIC) 20-25 MG tablet Take 1 tablet by mouth once daily 90 tablet 3   pravastatin (PRAVACHOL) 20 MG tablet Take 1 tablet by mouth once daily 90 tablet 3   No current facility-administered medications on file prior to visit.    No Known Allergies  Past Medical History:  Diagnosis Date   Cataract    not a surgical candidate at this time (10/20/2022)   Diabetes mellitus    GERD (gastroesophageal reflux disease)    no issues for years    Hyperlipidemia    controlled with pravastatin    Hypertension    controlled last 2-3 years    Past Surgical History:  Procedure Laterality Date   COLONOSCOPY     FINGER  SURGERY  1979   torn ligament   POLYPECTOMY     WISDOM TOOTH EXTRACTION  1989    Family History  Problem Relation Age of Onset   Diabetes Sister    Diabetes Brother    Heart attack Brother    Cancer Maternal Aunt        breast cancer   Cancer Maternal Uncle        throat   Heart disease Neg Hx    Colon cancer Neg Hx    Esophageal cancer Neg Hx    Rectal cancer Neg Hx    Stomach cancer Neg Hx    Colon polyps Neg Hx     Social History   Socioeconomic History   Marital status: Married    Spouse name: Not on file   Number of children: 1   Years of education: Not on file   Highest education level: Not on file  Occupational History   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   Occupation:     Occupation:    Tobacco Use   Smoking status: Never    Passive exposure: Never   Smokeless tobacco: Never  Vaping Use   Vaping Use: Never used  Substance and Sexual Activity   Alcohol use: No   Drug use: No   Sexual activity: Not on file  Other Topics Concern  Not on file  Social History Narrative   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  Pulmonary:     Effort: Pulmonary effort is normal. No respiratory distress.  Neurological:     Mental Status: He is alert.            Assessment & Plan:

## 2022-10-27 NOTE — Assessment & Plan Note (Signed)
Fairly mild but discussed anti viral treatment---he would like to start Will give paxlovid----hold pravastatin while on and cut amlodipine in half Symptom relief---tylenol and coricidin If worsens, to ER Quarantine this week---mask next week

## 2022-10-29 ENCOUNTER — Telehealth: Payer: Self-pay | Admitting: Gastroenterology

## 2022-10-29 ENCOUNTER — Telehealth: Payer: Self-pay | Admitting: *Deleted

## 2022-10-29 NOTE — Telephone Encounter (Signed)
Spoke with patient regarding his +COVID testing and informned him that he will need to be recheduled for both pre-visit and procedure. Pt stated understood gave # for reschule since pt had to go

## 2022-10-29 NOTE — Telephone Encounter (Signed)
Patient called states he has been testing for Covid-19 and they have been positive, is seeking advise because he has a procedure scheduled for 11/05/22.

## 2022-11-05 ENCOUNTER — Encounter: Payer: BC Managed Care – PPO | Admitting: Gastroenterology

## 2022-11-06 LAB — HM DIABETES EYE EXAM

## 2022-11-11 ENCOUNTER — Encounter: Payer: Self-pay | Admitting: Internal Medicine

## 2022-11-11 ENCOUNTER — Ambulatory Visit (INDEPENDENT_AMBULATORY_CARE_PROVIDER_SITE_OTHER): Payer: BC Managed Care – PPO | Admitting: Internal Medicine

## 2022-11-11 VITALS — BP 122/78 | HR 78 | Temp 97.6°F | Ht 75.5 in | Wt 222.0 lb

## 2022-11-11 DIAGNOSIS — Z23 Encounter for immunization: Secondary | ICD-10-CM

## 2022-11-11 DIAGNOSIS — I10 Essential (primary) hypertension: Secondary | ICD-10-CM

## 2022-11-11 DIAGNOSIS — E1159 Type 2 diabetes mellitus with other circulatory complications: Secondary | ICD-10-CM | POA: Diagnosis not present

## 2022-11-11 DIAGNOSIS — Z0000 Encounter for general adult medical examination without abnormal findings: Secondary | ICD-10-CM | POA: Diagnosis not present

## 2022-11-11 DIAGNOSIS — Z125 Encounter for screening for malignant neoplasm of prostate: Secondary | ICD-10-CM

## 2022-11-11 LAB — COMPREHENSIVE METABOLIC PANEL
ALT: 15 U/L (ref 0–53)
AST: 17 U/L (ref 0–37)
Albumin: 4.5 g/dL (ref 3.5–5.2)
Alkaline Phosphatase: 91 U/L (ref 39–117)
BUN: 10 mg/dL (ref 6–23)
CO2: 29 mEq/L (ref 19–32)
Calcium: 10.4 mg/dL (ref 8.4–10.5)
Chloride: 100 mEq/L (ref 96–112)
Creatinine, Ser: 0.88 mg/dL (ref 0.40–1.50)
GFR: 93.13 mL/min (ref >60.00)
Glucose, Bld: 108 mg/dL — ABNORMAL HIGH (ref 70–99)
Potassium: 3.8 mEq/L (ref 3.5–5.1)
Sodium: 136 mEq/L (ref 135–145)
Total Bilirubin: 0.6 mg/dL (ref 0.2–1.2)
Total Protein: 7.5 g/dL (ref 6.0–8.3)

## 2022-11-11 LAB — MICROALBUMIN / CREATININE URINE RATIO
Creatinine,U: 38.3 mg/dL
Microalb Creat Ratio: 1.8 mg/g (ref 0.0–30.0)
Microalb, Ur: <0.7 mg/dL (ref 0.0–1.9)

## 2022-11-11 LAB — CBC
HCT: 43.9 % (ref 39.0–52.0)
Hemoglobin: 14.9 g/dL (ref 13.0–17.0)
MCHC: 33.9 g/dL (ref 30.0–36.0)
MCV: 94.5 fl (ref 78.0–100.0)
Platelets: 319 10*3/uL (ref 150.0–400.0)
RBC: 4.65 Mil/uL (ref 4.22–5.81)
RDW: 12.3 % (ref 11.5–15.5)
WBC: 4.9 10*3/uL (ref 4.0–10.5)

## 2022-11-11 LAB — LIPID PANEL
Cholesterol: 160 mg/dL (ref 0–200)
HDL: 46.1 mg/dL (ref >39.00)
LDL Cholesterol: 103 mg/dL — ABNORMAL HIGH (ref 0–99)
NonHDL: 114.19
Total CHOL/HDL Ratio: 3
Triglycerides: 58 mg/dL (ref 0.0–149.0)
VLDL: 11.6 mg/dL (ref 0.0–40.0)

## 2022-11-11 LAB — PSA: PSA: 0.79 ng/mL (ref 0.10–4.00)

## 2022-11-11 LAB — HEMOGLOBIN A1C: Hgb A1c MFr Bld: 6.6 % — ABNORMAL HIGH (ref 4.6–6.5)

## 2022-11-11 NOTE — Addendum Note (Signed)
Addended by: Pilar Grammes on: 11/11/2022 01:39 PM   Modules accepted: Orders

## 2022-11-11 NOTE — Assessment & Plan Note (Signed)
Healthy Does exercise some---will be getting back to more regular Flu/COVID vaccines in the fall Td/shingrix #2 today Colon in APril Will check PSA

## 2022-11-11 NOTE — Assessment & Plan Note (Signed)
Hopefully still acceptable control If A1c >7%, will restart metformin '500mg'$  daily ??very early neuropathy--with the itching. No action

## 2022-11-11 NOTE — Assessment & Plan Note (Signed)
BP Readings from Last 3 Encounters:  11/11/22 122/78  03/06/22 114/76  08/23/21 128/82   Doing well with lisinopril/HCTZ 20/25 daily

## 2022-11-11 NOTE — Progress Notes (Signed)
Subjective:    Patient ID: Hunter Richmond, male    DOB: 1960/09/16, 62 y.o.   MRN: [id number]  HPI Here for physical  Feeling better from the [place] occasional bad itch on plantar feet Very random--and no rash Feet feel "wet" at times Wonders if it is related to liver----taking "liver capsules" Some numbness in the past--seems better  Sugars okay--up a little---still uses Libre Average under 180 except rarely. Still in zone >80% Not as consistent with proper eating and exercise Weight is up a bit  Current Outpatient Medications on File Prior to Visit  Medication Sig Dispense Refill   amLODipine (NORVASC) 10 MG tablet Take 1 tablet by mouth once daily 90 tablet 3   BAYER LOW DOSE 81 MG EC tablet Take 81-162 mg by mouth as needed for pain (or headaches). Swallow whole.     Continuous Blood Gluc Sensor (FREESTYLE LIBRE 2 SENSOR) MISC Inject 1 Device into the skin every 14 (fourteen) days. 2 each 12   glucose blood (ONETOUCH ULTRA) test strip USE  STRIP TO CHECK GLUCOSE ONCE DAILY 100 each 3   ibuprofen (ADVIL) 200 MG tablet Take 200-400 mg by mouth every 6 (six) hours as needed for mild pain or headache.     lisinopril-hydrochlorothiazide (ZESTORETIC) 20-25 MG tablet Take 1 tablet by mouth once daily 90 tablet 3   pravastatin (PRAVACHOL) 20 MG tablet Take 1 tablet by mouth once daily 90 tablet 3   No current facility-administered medications on file prior to visit.    No Known Allergies  Past Medical History:  Diagnosis Date   Cataract    not a surgical candidate at this time (10/20/2022)   Diabetes mellitus    GERD (gastroesophageal reflux disease)    no issues for years    Hyperlipidemia    controlled with pravastatin    Hypertension    controlled last 2-3 years    Past Surgical History:  Procedure Laterality Date   COLONOSCOPY     FINGER SURGERY  1979   torn ligament   POLYPECTOMY     WISDOM TOOTH EXTRACTION  1989    Family History  Problem Relation Age  of Onset   Diabetes Sister    Diabetes Brother    Heart attack Brother    Cancer Maternal Aunt        breast cancer   Cancer Maternal Uncle        throat   Heart disease Neg Hx    Colon cancer Neg Hx    Esophageal cancer Neg Hx    Rectal cancer Neg Hx    Stomach cancer Neg Hx    Colon polyps Neg Hx     Social History   Socioeconomic History   Marital status: Married    Spouse name: Not on file   Number of children: 1   Years of education: Not on file   Highest education level: Not on file  Occupational History   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   Occupation:     Occupation:    Tobacco Use   Smoking status: Never    Passive exposure: Never   Smokeless tobacco: Never  Vaping Use   Vaping Use: Never used  Substance and Sexual Activity   Alcohol use: No   Drug use: No   Sexual activity: Not on file  Other Topics Concern   Not on file  Social History Narrative   S[employer]ist--  vegetarian   Social Determinants of Radio broadcast assistant Strain: Not on file  Food Insecurity: Not on file  Transportation Needs: Not on file  Physical Activity: Not on file  Stress: Not on file  Social Connections: Not on file  Intimate Partner Violence: Not on file   Review of Systems  Constitutional:  Negative for fatigue.       Weight is up some Wears seat belt Has had some mild night sweats  HENT:  Negative for hearing loss, tinnitus and trouble swallowing.        Had chipped tooth ---will be seeing periodontist (may need root canal)  Eyes:  Negative for visual disturbance.  Respiratory:  Negative for cough, chest tightness and shortness of breath.   Cardiovascular:  Negative for chest pain and palpitations.       Some swelling with prolonged driving--does have compression socks to wear  Gastrointestinal:  Negative for blood in stool and constipation.       Some burning at times--antacid helps (and omeprazole briefly)   Endocrine: Negative for polydipsia and polyuria.  Genitourinary:  Negative for difficulty urinating and urgency.       No sexual problems  Musculoskeletal:  Negative for arthralgias, back pain and joint swelling.  Skin:  Negative for rash.  Allergic/Immunologic: Negative for environmental allergies and immunocompromised state.  Neurological:  Negative for dizziness, syncope, light-headedness and headaches.  Hematological:  Negative for adenopathy. Does not bruise/bleed easily.  Psychiatric/Behavioral:  Negative for dysphoric mood and sleep disturbance. The patient is not nervous/anxious.        Some anger still when he hears about a murder (lost brother to murder)       Objective:   Physical Exam Constitutional:      Appearance: Normal appearance.  HENT:     Mouth/Throat:     Pharynx: No oropharyngeal exudate or posterior oropharyngeal erythema.  Eyes:     Conjunctiva/sclera: Conjunctivae normal.     Pupils: Pupils are equal, round, and reactive to light.  Cardiovascular:     Rate and Rhythm: Normal rate and regular rhythm.     Pulses: Normal pulses.     Heart sounds:     No gallop.  Pulmonary:     Effort: Pulmonary effort is normal.     Breath sounds: Normal breath sounds. No wheezing or rales.  Abdominal:     Palpations: Abdomen is soft.     Tenderness: There is no abdominal tenderness.  Musculoskeletal:     Cervical back: Neck supple.     Right lower leg: No edema.     Left lower leg: No edema.  Lymphadenopathy:     Cervical: No cervical adenopathy.  Skin:    Findings: No lesion or rash.     Comments: No foot lesions  Neurological:     General: No focal deficit present.     Mental Status: He is alert and oriented to person, place, and time.     Comments: Normal sensation in plantar feet  Psychiatric:        Mood and Affect: Mood normal.        Behavior: Behavior normal.            Assessment & Plan:

## 2022-12-15 ENCOUNTER — Encounter: Payer: Self-pay | Admitting: Gastroenterology

## 2022-12-15 ENCOUNTER — Ambulatory Visit (AMBULATORY_SURGERY_CENTER): Payer: BC Managed Care – PPO

## 2022-12-15 VITALS — Ht 75.5 in | Wt 215.0 lb

## 2022-12-15 DIAGNOSIS — Z86010 Personal history of colonic polyps: Secondary | ICD-10-CM

## 2022-12-15 NOTE — Progress Notes (Signed)
No egg or soy allergy known to patient  No issues known to pt with past sedation with any surgeries or procedures Patient denies ever being told they had issues or difficulty with intubation  No FH of Malignant Hyperthermia Pt is not on diet pills Pt is not on  home 02  Pt is not on blood thinners  Pt denies issues with constipation  No A fib or A flutter Have any cardiac testing pending--no  Patient's chart reviewed by John Nulty CNRA prior to previsit and patient appropriate for the [facility].  Previsit completed and red dot placed by patient's name on their procedure day (on provider's schedule).    

## 2022-12-28 ENCOUNTER — Encounter: Payer: Self-pay | Admitting: Certified Registered Nurse Anesthetist

## 2022-12-29 ENCOUNTER — Ambulatory Visit (AMBULATORY_SURGERY_CENTER): Payer: BC Managed Care – PPO | Admitting: Gastroenterology

## 2022-12-29 ENCOUNTER — Encounter: Payer: Self-pay | Admitting: Gastroenterology

## 2022-12-29 VITALS — BP 124/78 | HR 54 | Temp 97.3°F | Resp 10 | Ht 75.5 in | Wt 215.0 lb

## 2022-12-29 DIAGNOSIS — Z86010 Personal history of colonic polyps: Secondary | ICD-10-CM | POA: Diagnosis not present

## 2022-12-29 DIAGNOSIS — Z09 Encounter for follow-up examination after completed treatment for conditions other than malignant neoplasm: Secondary | ICD-10-CM

## 2022-12-29 DIAGNOSIS — Z1211 Encounter for screening for malignant neoplasm of colon: Secondary | ICD-10-CM | POA: Diagnosis not present

## 2022-12-29 MED ORDER — SODIUM CHLORIDE 0.9 % IV SOLN: 500.0000 mL | Freq: Once | INTRAVENOUS | Status: DC

## 2022-12-29 NOTE — Progress Notes (Signed)
Report given to PACU, vss 

## 2022-12-29 NOTE — Patient Instructions (Signed)
Please read handouts provided. Continue present medications. Repeat colonoscopy in 5 years for screening.   YOU HAD AN ENDOSCOPIC PROCEDURE TODAY AT THE [facility]:   Refer to the procedure report that was given to you for any specific questions about what was found during the examination.  If the procedure report does not answer your questions, please call your gastroenterologist to clarify.  If you requested that your care partner not be given the details of your procedure findings, then the procedure report has been included in a sealed envelope for you to review at your convenience later.  YOU SHOULD EXPECT: Some feelings of bloating in the abdomen. Passage of more gas than usual.  Walking can help get rid of the air that was put into your GI tract during the procedure and reduce the bloating. If you had a lower endoscopy (such as a colonoscopy or flexible sigmoidoscopy) you may notice spotting of blood in your stool or on the toilet paper. If you underwent a bowel prep for your procedure, you may not have a normal bowel movement for a few days.  Please Note:  You might notice some irritation and congestion in your nose or some drainage.  This is from the oxygen used during your procedure.  There is no need for concern and it should clear up in a day or so.  SYMPTOMS TO REPORT IMMEDIATELY:  Following lower endoscopy (colonoscopy or flexible sigmoidoscopy):  Excessive amounts of blood in the stool  Significant tenderness or worsening of abdominal pains  Swelling of the abdomen that is new, acute  Fever of 100F or higher  For urgent or emergent issues, a gastroenterologist can be reached at any hour by calling [phone]. Do not use MyChart messaging for urgent concerns.    DIET:  We do recommend a small meal at first, but then you may proceed to your regular diet.  Drink plenty of fluids but you should avoid alcoholic beverages for 24 hours.  ACTIVITY:  You should plan  to take it easy for the rest of today and you should NOT DRIVE or use heavy machinery until tomorrow (because of the sedation medicines used during the test).    FOLLOW UP: Our staff will call the number listed on your records the next business day following your procedure.  We will call around 7:15- 8:00 am to check on you and address any questions or concerns that you may have regarding the information given to you following your procedure. If we do not reach you, we will leave a message.     If any biopsies were taken you will be contacted by phone or by letter within the next 1-3 weeks.  Please call us at [phone] if you have not heard about the biopsies in 3 weeks.    SIGNATURES/CONFIDENTIALITY: You and/or your care partner have signed paperwork which will be entered into your electronic medical record.  These signatures attest to the fact that that the information above on your After Visit Summary has been reviewed and is understood.  Full responsibility of the confidentiality of this discharge information lies with you and/or your care-partner. 

## 2022-12-29 NOTE — Progress Notes (Signed)
[facility] History and Physical   Primary Care Karie Schwalbetvak, Richard I, MD   Reason for Procedure:   History of colon polyps  Plan:    colonoscopy     HPI: Hunter Richmond is a 62 y.o. male  here for colonoscopy surveillance- numerous adenomas removed in the past, 9 removed in 2020 and additional polyps removed in 2017.    Patient denies any bowel symptoms at this time. No family history of colon cancer known. Otherwise feels well without any cardiopulmonary symptoms.   I have discussed risks / benefits of anesthesia and endoscopic procedure with Rolland Bimler and they wish to proceed with the exams as outlined today.    Past Medical History:  Diagnosis Date   Cataract    not a surgical candidate at this time (10/20/2022)   Diabetes mellitus    GERD (gastroesophageal reflux disease)    no issues for years    Hyperlipidemia    controlled with pravastatin    Hypertension    controlled last 2-3 years    Past Surgical History:  Procedure Laterality Date   COLONOSCOPY  07/07/2019   COLONOSCOPY     FINGER SURGERY  1979   torn ligament   POLYPECTOMY     WISDOM TOOTH EXTRACTION  1989    Prior to Admission medications   Medication Sig Start Date End Date Taking? Authorizing Provider  amLODipine (NORVASC) 10 MG tablet Take 1 tablet by mouth once daily 12/22/21  Yes Karie Schwalbe, MD  lisinopril-hydrochlorothiazide (ZESTORETIC) 20-25 MG tablet Take 1 tablet by mouth once daily 12/22/21  Yes Karie Schwalbe, MD  pravastatin (PRAVACHOL) 20 MG tablet Take 1 tablet by mouth once daily 12/22/21  Yes Karie Schwalbe, MD  BAYER LOW DOSE 81 MG EC tablet Take 81-162 mg by mouth every 6 (six) hours as needed for pain (or headaches). Swallow whole.    [provider]  Continuous Blood Gluc Sensor (FREESTYLE LIBRE 2 SENSOR) MISC Inject 1 Device into the skin every 14 (fourteen) days. 05/12/22   Tillman Abide I, MD  glucose blood (ONETOUCH ULTRA) test strip  USE  STRIP TO CHECK GLUCOSE ONCE DAILY 09/16/22   Karie Schwalbe, MD  ibuprofen (ADVIL) 200 MG tablet Take 200-400 mg by mouth every 6 (six) hours as needed for mild pain or headache.    [provider]  ibuprofen (ADVIL) 600 MG tablet Take 600 mg by mouth. 11/19/22   [provider]    Current Outpatient Medications  Medication Sig Dispense Refill   amLODipine (NORVASC) 10 MG tablet Take 1 tablet by mouth once daily 90 tablet 3   lisinopril-hydrochlorothiazide (ZESTORETIC) 20-25 MG tablet Take 1 tablet by mouth once daily 90 tablet 3   pravastatin (PRAVACHOL) 20 MG tablet Take 1 tablet by mouth once daily 90 tablet 3   BAYER LOW DOSE 81 MG EC tablet Take 81-162 mg by mouth every 6 (six) hours as needed for pain (or headaches). Swallow whole.     Continuous Blood Gluc Sensor (FREESTYLE LIBRE 2 SENSOR) MISC Inject 1 Device into the skin every 14 (fourteen) days. 2 each 12   glucose blood (ONETOUCH ULTRA) test strip USE  STRIP TO CHECK GLUCOSE ONCE DAILY 100 each 3   ibuprofen (ADVIL) 200 MG tablet Take 200-400 mg by mouth every 6 (six) hours as needed for mild pain or headache.     ibuprofen (ADVIL) 600 MG tablet Take 600 mg by mouth.  Current Facility-Administered Medications  Medication Dose Route Frequency Provider Last Rate Last Admin   0.9 %  sodium chloride infusion  500 mL Intravenous Once Niang Mitcheltree, Willaim Rayas, MD        Allergies as of 12/29/2022   (No Known Allergies)    Family History  Problem Relation Age of Onset   Diabetes Sister    Diabetes Brother    Heart attack Brother    Cancer Maternal Aunt        breast cancer   Cancer Maternal Uncle        throat   Heart disease Neg Hx    Colon cancer Neg Hx    Esophageal cancer Neg Hx    Rectal cancer Neg Hx    Stomach cancer Neg Hx    Colon polyps Neg Hx     Social History   Socioeconomic History   Marital status: Married    Spouse name: Not on file   Number of children: 1   Years of education:  Not on file   Highest education level: Not on file  Occupational History   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   Occupation:     Occupation:    Tobacco Use   Smoking status: Never    Passive exposure: Never   Smokeless tobacco: Never  Vaping Use   Vaping Use: Never used  Substance and Sexual Activity   Alcohol use: Never   Drug use: Never   Sexual activity: Yes  Other Topics Concern   Not on file  Social History Narrative   [employer]-- vegetarian   Social Determinants of Health   Financial Resource Strain: Not on file  Food Insecurity: Not on file  Transportation Needs: Not on file  Physical Activity: Not on file  Stress: Not on file  Social Connections: Not on file  Intimate Partner Violence: Not on file    Review of Systems: All other review of systems negative except as mentioned in the HPI.  Physical Exam: Vital signs BP (!) 149/83   Pulse 65   Temp (!) 97.3 F (36.3 C)   Resp 13   Ht 6' 3.5" (1.918 m)   Wt 215 lb (97.5 kg)   SpO2 100%   BMI 26.52 kg/m   General:   Alert,  Well-developed, pleasant and cooperative in NAD Lungs:  Clear throughout to auscultation.   Heart:  Regular rate and rhythm Abdomen:  Soft, nontender and nondistended.   Neuro/Psych:  Alert and cooperative. Normal mood and affect. A and O x 3  Harlin Rain, MD [facility]

## 2022-12-29 NOTE — Op Note (Signed)
[facility] Patient Name: Hunter Richmond Procedure Date: 12/29/2022 10:06 AM MRN: [id number] Endoscopist: Viviann Spare P. Adela Lank , MD, [id number] Age: 62 Referring MD:  Date of Birth: 1961/01/31 Gender: Male Account #: [id number] Procedure:                Colonoscopy Indications:              High risk colon cancer surveillance: Personal                            history of colonic polyps - numerous adenomas                            removed in the past, 9 adenomas removed 10/20 and                            numerous adenomas in 02/2016 as well Medicines:                Monitored Anesthesia Care Procedure:                Pre-Anesthesia Assessment:                           - Prior to the procedure, a History and Physical                            was performed, and patient medications and                            allergies were reviewed. The patient's tolerance of                            previous anesthesia was also reviewed. The risks                            and benefits of the procedure and the sedation                            options and risks were discussed with the patient.                            All questions were answered, and informed consent                            was obtained. Prior Anticoagulants: The patient has                            taken no anticoagulant or antiplatelet agents. ASA                            Grade Assessment: II - A patient with mild systemic                            disease. After reviewing the risks and benefits,  the patient was deemed in satisfactory condition to                            undergo the procedure.                           After obtaining informed consent, the colonoscope                            was passed under direct vision. Throughout the                            procedure, the patient's blood pressure, pulse, and                            oxygen saturations were  monitored continuously. The                            CF [id number] #[id number] was introduced through the anus                            and advanced to the the cecum, identified by                            appendiceal orifice and ileocecal valve. The                            colonoscopy was performed without difficulty. The                            patient tolerated the procedure well. The quality                            of the bowel preparation was adequate. The                            ileocecal valve, appendiceal orifice, and rectum                            were photographed. Scope In: 10:23:13 AM Scope Out: 10:46:42 AM Scope Withdrawal Time: 0 hours 14 minutes 54 seconds  Total Procedure Duration: 0 hours 23 minutes 29 seconds  Findings:                 The perianal and digital rectal examinations were                            normal.                           The colon was tortuous. Abdominal pressure and                            positional changes utilized to achieve cecal  intubation.                           Internal hemorrhoids were found during retroflexion.                           The exam was otherwise without abnormality. No                            polyps. Complications:            No immediate complications. Estimated blood loss:                            None. Estimated Blood Loss:     Estimated blood loss: none. Impression:               - Tortuous colon.                           - Internal hemorrhoids.                           - The examination was otherwise normal.                           - No polyps.                           - The GI Genius (intelligent endoscopy module),                            computer-aided polyp detection system powered by AI                            was utilized to detect colorectal polyps through                            enhanced visualization during colonoscopy. Recommendation:            - Patient has a contact number available for                            emergencies. The signs and symptoms of potential                            delayed complications were discussed with the                            patient. Return to normal activities tomorrow.                            Written discharge instructions were provided to the                            patient.                           -  Resume previous diet.                           - Continue present medications.                           - Repeat colonoscopy in 5 years for surveillance                            given burden of polyps noted on the last exam. Viviann Spare P. Vianca Bracher, MD 12/29/2022 10:51:27 AM This report has been signed electronically.

## 2022-12-29 NOTE — Progress Notes (Signed)
Pt's states no medical or surgical changes since previsit or office visit. 

## 2022-12-30 ENCOUNTER — Telehealth: Payer: Self-pay | Admitting: *Deleted

## 2022-12-30 NOTE — Telephone Encounter (Signed)
  Follow up Call-     12/29/2022    9:30 AM  Call back number  Post procedure Call Back phone  # [phone]  Permission to leave phone message Yes     Patient questions:  Do you have a fever, pain , or abdominal swelling? No. Pain Score  0 *  Have you tolerated food without any problems? Yes.    Have you been able to return to your normal activities? Yes.    Do you have any questions about your discharge instructions: Diet   No. Medications  No. Follow up visit  No.  Do you have questions or concerns about your Care? No.  Actions: * If pain score is 4 or above: No action needed, pain <4.

## 2023-01-23 ENCOUNTER — Ambulatory Visit: Payer: BC Managed Care – PPO

## 2023-01-23 ENCOUNTER — Ambulatory Visit: Payer: BC Managed Care – PPO | Admitting: Podiatry

## 2023-01-23 DIAGNOSIS — T148XXA Other injury of unspecified body region, initial encounter: Secondary | ICD-10-CM

## 2023-01-23 DIAGNOSIS — E119 Type 2 diabetes mellitus without complications: Secondary | ICD-10-CM | POA: Diagnosis not present

## 2023-01-23 DIAGNOSIS — M25571 Pain in right ankle and joints of right foot: Secondary | ICD-10-CM

## 2023-01-23 NOTE — Progress Notes (Signed)
  Subjective:  Patient ID: Hunter Richmond, male    DOB: 05-12-1961,  MRN: [id number]  Chief Complaint  Patient presents with   Ankle Pain    Patient bumped inside of right ankle with bed rail Sunday. He has been having pain and has been soaking his foot with epsom salt and warm water and he stated he is a lot better.     62  y.o. male presents with concern for prior injury to the inside of the right ankle.  He bumped his right ankle on a bed rail this past Sunday.  He has been soaking in Epsom salts and warm water and says that it is now a lot better nearly 95% improved from prior.  Does not have much tenderness at all and is back to walking in regular shoes and doing his regular activities.  Patient does have a history of type 2 diabetes and wanted to get this checked out.  Past Medical History:  Diagnosis Date   Cataract    not a surgical candidate at this time (10/20/2022)   Diabetes mellitus    GERD (gastroesophageal reflux disease)    no issues for years    Hyperlipidemia    controlled with pravastatin    Hypertension    controlled last 2-3 years    No Known Allergies  ROS: Negative except as per HPI above  Objective:  General: AAO x3, NAD  Dermatological: With inspection and palpation of the right and left lower extremities there are no open sores, no preulcerative lesions, no rash or signs of infection present. Nails are of normal length thickness and coloration.   Vascular:  Dorsalis Pedis artery and Posterior Tibial artery pedal pulses are 2/4 bilateral.  Capillary fill time < 3 sec to all digits.   Neruologic: Grossly intact via light touch bilateral. Protective threshold intact to all sites bilateral.   Musculoskeletal: No no significant edema noted about the right medial ankle.  There is minimal to no tenderness with palpation over the medial malleolus.  No evidence of ecchymosis or skin damage.  No evidence of fracture.  Gait: Unassisted, Nonantalgic.   No images are  attached to the encounter.  Radiographs:  Deferred as patient can walk normally in regular shoes and does not have pain on palpation of the medial malleolus Assessment:   1. Contusion of bone   2. Acute right ankle pain   3. Type 2 diabetes mellitus without complication, without long-term current use of insulin (HCC)      Plan:  Patient was evaluated and treated and all questions answered.  # Contusion of bone medial malleolus right ankle -Discussed with patient he likely sustained a deep bone bruise of the medial malleolus however does not appear to have sustained any fracture from his injury -Believe that any lingering tenderness will go down with the next week to 2 weeks. -Recommend continued monitoring if he has any pain develop should call to come back in for an appointment however no evidence of fracture or other injury that will not self resolve with further conservative care  Return if symptoms worsen or fail to improve.          Corinna Gab, DPM [facility]

## 2023-01-26 NOTE — Telephone Encounter (Signed)
Sent a medical record request to [facility] to get eye exam office notes.

## 2023-02-10 ENCOUNTER — Other Ambulatory Visit: Payer: Self-pay | Admitting: Internal Medicine

## 2023-04-15 ENCOUNTER — Encounter (INDEPENDENT_AMBULATORY_CARE_PROVIDER_SITE_OTHER): Payer: Self-pay

## 2023-05-04 ENCOUNTER — Encounter: Payer: Self-pay | Admitting: Internal Medicine

## 2023-05-04 ENCOUNTER — Telehealth (INDEPENDENT_AMBULATORY_CARE_PROVIDER_SITE_OTHER): Payer: BC Managed Care – PPO | Admitting: Internal Medicine

## 2023-05-04 VITALS — BP 118/73 | Wt 215.0 lb

## 2023-05-04 DIAGNOSIS — U071 COVID-19: Secondary | ICD-10-CM

## 2023-05-04 NOTE — Progress Notes (Signed)
Subjective:    Patient ID: Hunter Richmond, male    DOB: Jul 10, 1961, 62 y.o.   MRN: [id number]  HPI Video virtual visit due to COVID infection Identification done Reviewed limitations and billing and he gave consent Participants--patient in his home and I am in my office  Started 3 days ago--fairly mild Slight cough--intermittent Negative COVID test that day Was positive yesterday though Mild runny nose yesterday Most back to normal today  Slight fever--low grade and brief No headache No SOB No chills or sweats now---one sweat yesterday (but this is not new for him)  Daughter and wife had COVID last week  Current Outpatient Medications on File Prior to Visit  Medication Sig Dispense Refill   amLODipine (NORVASC) 10 MG tablet Take 1 tablet by mouth once daily 90 tablet 0   BAYER LOW DOSE 81 MG EC tablet Take 81-162 mg by mouth every 6 (six) hours as needed for pain (or headaches). Swallow whole.     Continuous Blood Gluc Sensor (FREESTYLE LIBRE 2 SENSOR) MISC Inject 1 Device into the skin every 14 (fourteen) days. 2 each 12   glucose blood (ONETOUCH ULTRA) test strip USE  STRIP TO CHECK GLUCOSE ONCE DAILY 100 each 3   ibuprofen (ADVIL) 200 MG tablet Take 200-400 mg by mouth every 6 (six) hours as needed for mild pain or headache.     ibuprofen (ADVIL) 600 MG tablet Take 600 mg by mouth.     lisinopril-hydrochlorothiazide (ZESTORETIC) 20-25 MG tablet Take 1 tablet by mouth once daily 90 tablet 0   pravastatin (PRAVACHOL) 20 MG tablet Take 1 tablet by mouth once daily 90 tablet 0   No current facility-administered medications on file prior to visit.    No Known Allergies  Past Medical History:  Diagnosis Date   Cataract    not a surgical candidate at this time (10/20/2022)   Diabetes mellitus    GERD (gastroesophageal reflux disease)    no issues for years    Hyperlipidemia    controlled with pravastatin    Hypertension    controlled last 2-3 years    Past Surgical  History:  Procedure Laterality Date   COLONOSCOPY  07/07/2019   COLONOSCOPY     FINGER SURGERY  1979   torn ligament   POLYPECTOMY     WISDOM TOOTH EXTRACTION  1989    Family History  Problem Relation Age of Onset   Diabetes Sister    Diabetes Brother    Heart attack Brother    Cancer Maternal Aunt        breast cancer   Cancer Maternal Uncle        throat   Heart disease Neg Hx    Colon cancer Neg Hx    Esophageal cancer Neg Hx    Rectal cancer Neg Hx    Stomach cancer Neg Hx    Colon polyps Neg Hx     Social History   Socioeconomic History   Marital status: Married    Spouse name: Not on file   Number of children: 1   Years of education: Not on file   Highest education level: Not on file  Occupational History   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   Occupation:     Occupation:    Tobacco Use   Smoking status: Never    Passive exposure: Never   Smokeless tobacco: Never  Vaping Use   Vaping status: Never Used  Substance  and Sexual Activity   Alcohol use: Never   Drug use: Never   Sexual activity: Yes  Other Topics Concern   Not on file  Social History Narrative   [employer]-- vegetarian   Social Determinants of Health   Financial Resource Strain: Not on file  Food Insecurity: Not on file  Transportation Needs: Not on file  Physical Activity: Not on file  Stress: Not on file  Social Connections: Unknown (01/27/2022)   Received from [facility], [facility]   Social Network    Social Network: Not on file  Intimate Partner Violence: Unknown (12/19/2021)   Received from [facility], [facility]   HITS    Physically Hurt: Not on file    Insult or Talk Down To: Not on file    Threaten Physical Harm: Not on file    Scream or Curse: Not on file   Review of Systems No loss of smell or taste No N/V Eating okay    Objective:   Physical Exam Constitutional:      Appearance: Normal appearance.   Pulmonary:     Effort: Pulmonary effort is normal. No respiratory distress.  Neurological:     Mental Status: He is alert.            Assessment & Plan:

## 2023-05-04 NOTE — Assessment & Plan Note (Signed)
Mild infection 4th day Discussed paxlovid--not indicated Discussed symptom relief--analgesic, cough med Return to work 8/21 if back to normal (mask for a week)

## 2023-05-14 ENCOUNTER — Encounter: Payer: Self-pay | Admitting: Internal Medicine

## 2023-05-14 ENCOUNTER — Ambulatory Visit: Payer: BC Managed Care – PPO | Admitting: Internal Medicine

## 2023-05-14 VITALS — BP 128/86 | HR 68 | Temp 98.0°F | Ht 75.25 in | Wt 221.0 lb

## 2023-05-14 DIAGNOSIS — E1159 Type 2 diabetes mellitus with other circulatory complications: Secondary | ICD-10-CM | POA: Diagnosis not present

## 2023-05-14 DIAGNOSIS — L723 Sebaceous cyst: Secondary | ICD-10-CM

## 2023-05-14 DIAGNOSIS — I10 Essential (primary) hypertension: Secondary | ICD-10-CM

## 2023-05-14 LAB — POCT GLYCOSYLATED HEMOGLOBIN (HGB A1C): Hemoglobin A1C: 6.4 % — AB (ref 4.0–5.6)

## 2023-05-14 NOTE — Assessment & Plan Note (Signed)
Mentions non painful lump in left axilla Exam shows <1cm well circuscribed mass---not node Discussed --if gets inflamed, would try empiric cephalexin

## 2023-05-14 NOTE — Assessment & Plan Note (Signed)
Doing well without medications Is on pravastatin A1c 6.4% today--slightly better

## 2023-05-14 NOTE — Assessment & Plan Note (Addendum)
BP Readings from Last 3 Encounters:  05/14/23 128/86  05/04/23 118/73  12/29/22 124/78   Good control on lisinopril/hydrochlorothiazide 20/25 and amlodipine 10mg  daily

## 2023-05-14 NOTE — Progress Notes (Signed)
Subjective:    Patient ID: Hunter Richmond, male    DOB: [birth year], 62 y.o.   MRN: [id number]  HPI Here for follow up of diabetes and other chronic health conditions  Feels back to normal from COVID It was very mild  Checks sugars regularly with CGM Most of time he is in the target range Still no meds for the past 2 years Continues to exercise---daily peloton (or [employer])  No chest pain No SOB No dizziness or syncope  Current Outpatient Medications on File Prior to Visit  Medication Sig Dispense Refill   amLODipine (NORVASC) 10 MG tablet Take 1 tablet by mouth once daily 90 tablet 0   BAYER LOW DOSE 81 MG EC tablet Take 81-162 mg by mouth every 6 (six) hours as needed for pain (or headaches). Swallow whole.     Continuous Blood Gluc Sensor (FREESTYLE LIBRE 2 SENSOR) MISC Inject 1 Device into the skin every 14 (fourteen) days. 2 each 12   glucose blood (ONETOUCH ULTRA) test strip USE  STRIP TO CHECK GLUCOSE ONCE DAILY 100 each 3   ibuprofen (ADVIL) 200 MG tablet Take 200-400 mg by mouth every 6 (six) hours as needed for mild pain or headache.     ibuprofen (ADVIL) 600 MG tablet Take 600 mg by mouth.     lisinopril-hydrochlorothiazide (ZESTORETIC) 20-25 MG tablet Take 1 tablet by mouth once daily 90 tablet 0   pravastatin (PRAVACHOL) 20 MG tablet Take 1 tablet by mouth once daily 90 tablet 0   No current facility-administered medications on file prior to visit.    No Known Allergies  Past Medical History:  Diagnosis Date   Cataract    not a surgical candidate at this time (10/20/2022)   Diabetes mellitus    GERD (gastroesophageal reflux disease)    no issues for years    Hyperlipidemia    controlled with pravastatin    Hypertension    controlled last 2-3 years    Past Surgical History:  Procedure Laterality Date   COLONOSCOPY  07/07/2019   COLONOSCOPY     FINGER SURGERY  1979   torn ligament   POLYPECTOMY     WISDOM TOOTH EXTRACTION  1989    Family  History  Problem Relation Age of Onset   Diabetes Sister    Diabetes Brother    Heart attack Brother    Cancer Maternal Aunt        breast cancer   Cancer Maternal Uncle        throat   Heart disease Neg Hx    Colon cancer Neg Hx    Esophageal cancer Neg Hx    Rectal cancer Neg Hx    Stomach cancer Neg Hx    Colon polyps Neg Hx     Social History   Socioeconomic History   Marital status: Married    Spouse name: Not on file   Number of children: 1   Years of education: Not on file   Highest education level: Not on file  Occupational History   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   Occupation:     Occupation:    Tobacco Use   Smoking status: Never    Passive exposure: Never   Smokeless tobacco: Never  Vaping Use   Vaping status: Never Used  Substance and Sexual Activity   Alcohol use: Never   Drug use: Never   Sexual activity: Yes  Other Topics Concern  Not on file  Social History Narrative   S[employer]-- vegetarian   Social Determinants of Health   Financial Resource Strain: Not on file  Food Insecurity: Not on file  Transportation Needs: Not on file  Physical Activity: Not on file  Stress: Not on file  Social Connections: Unknown (01/27/2022)   Received from [facility], [facility]   Social Network    Social Network: Not on file  Intimate Partner Violence: Unknown (12/19/2021)   Received from [facility], [facility]   HITS    Physically Hurt: Not on file    Insult or Talk Down To: Not on file    Threaten Physical Harm: Not on file    Scream or Curse: Not on file   Review of Systems 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  Cardiovascular:     Rate and Rhythm: Normal rate and regular rhythm.     Pulses: Normal pulses.     Heart sounds: No murmur  heard.    No gallop.  Pulmonary:     Effort: Pulmonary effort is normal.     Breath sounds: Normal breath sounds. No wheezing or rales.  Musculoskeletal:     Cervical back: Neck supple.     Right lower leg: No edema.     Left lower leg: No edema.  Lymphadenopathy:     Cervical: No cervical adenopathy.  Skin:    Comments: No foot lesions  Neurological:     Mental Status: He is alert.            Assessment & Plan:

## 2023-05-14 NOTE — Addendum Note (Signed)
Addended by: Eual Fines on: 05/14/2023 03:16 PM   Modules accepted: Orders

## 2023-05-20 ENCOUNTER — Other Ambulatory Visit: Payer: Self-pay | Admitting: Internal Medicine

## 2023-06-01 ENCOUNTER — Other Ambulatory Visit: Payer: Self-pay | Admitting: Internal Medicine

## 2023-10-09 ENCOUNTER — Telehealth: Payer: Self-pay | Admitting: Pharmacy Technician

## 2023-10-09 ENCOUNTER — Other Ambulatory Visit (HOSPITAL_COMMUNITY): Payer: Self-pay

## 2023-10-09 NOTE — Telephone Encounter (Signed)
Pharmacy Patient Advocate Encounter  Received notification from [facility] that Prior Authorization for FreeStyle Libre 2 Sensor has been APPROVED from 10/09/2023 to 10/07/2024. Ran test claim, Copay is $50.00. This test claim was processed through [facility]- copay amounts may vary at other pharmacies due to pharmacy/plan contracts, or as the patient moves through the different stages of their insurance plan.   PA #/Case ID/Reference #: [id number] Key: [id number]

## 2023-11-03 ENCOUNTER — Other Ambulatory Visit: Payer: Self-pay | Admitting: Internal Medicine

## 2023-11-03 DIAGNOSIS — E1140 Type 2 diabetes mellitus with diabetic neuropathy, unspecified: Secondary | ICD-10-CM

## 2023-11-03 DIAGNOSIS — Z125 Encounter for screening for malignant neoplasm of prostate: Secondary | ICD-10-CM

## 2023-11-13 ENCOUNTER — Other Ambulatory Visit: Payer: BC Managed Care – PPO

## 2023-11-13 DIAGNOSIS — E1140 Type 2 diabetes mellitus with diabetic neuropathy, unspecified: Secondary | ICD-10-CM

## 2023-11-13 DIAGNOSIS — Z125 Encounter for screening for malignant neoplasm of prostate: Secondary | ICD-10-CM | POA: Diagnosis not present

## 2023-11-13 NOTE — Addendum Note (Signed)
 Addended by: Alvina Chou on: 11/13/2023 02:16 PM   Modules accepted: Orders

## 2023-11-14 ENCOUNTER — Encounter: Payer: Self-pay | Admitting: Internal Medicine

## 2023-11-14 LAB — COMPREHENSIVE METABOLIC PANEL WITH GFR
AG Ratio: 1.8 (calc) (ref 1.0–2.5)
ALT: 19 U/L (ref 9–46)
AST: 18 U/L (ref 10–35)
Albumin: 4.7 g/dL (ref 3.6–5.1)
Alkaline phosphatase (APISO): 76 U/L (ref 35–144)
BUN: 17 mg/dL (ref 7–25)
CO2: 27 mmol/L (ref 20–32)
Calcium: 10.2 mg/dL (ref 8.6–10.3)
Chloride: 101 mmol/L (ref 98–110)
Creat: 0.99 mg/dL (ref 0.70–1.35)
Globulin: 2.6 g/dL (ref 1.9–3.7)
Glucose, Bld: 132 mg/dL — ABNORMAL HIGH (ref 65–99)
Potassium: 3.8 mmol/L (ref 3.5–5.3)
Sodium: 139 mmol/L (ref 135–146)
Total Bilirubin: 0.5 mg/dL (ref 0.2–1.2)
Total Protein: 7.3 g/dL (ref 6.1–8.1)

## 2023-11-14 LAB — CBC
HCT: 46.2 % (ref 38.5–50.0)
Hemoglobin: 15.3 g/dL (ref 13.2–17.1)
MCH: 31.7 pg (ref 27.0–33.0)
MCHC: 33.1 g/dL (ref 32.0–36.0)
MCV: 95.9 fL (ref 80.0–100.0)
MPV: 11.2 fL (ref 7.5–12.5)
Platelets: 278 Thousand/uL (ref 140–400)
RBC: 4.82 Million/uL (ref 4.20–5.80)
RDW: 11.5 % (ref 11.0–15.0)
WBC: 5.8 Thousand/uL (ref 3.8–10.8)

## 2023-11-14 LAB — LIPID PANEL
Cholesterol: 178 mg/dL (ref <200)
HDL: 52 mg/dL (ref >=40)
LDL Cholesterol (Calc): 109 mg/dL — ABNORMAL HIGH
Non-HDL Cholesterol (Calc): 126 mg/dL (ref <130)
Total CHOL/HDL Ratio: 3.4 (calc) (ref <5.0)
Triglycerides: 83 mg/dL (ref <150)

## 2023-11-14 LAB — HEMOGLOBIN A1C
Hgb A1c MFr Bld: 6.9 %{Hb} — ABNORMAL HIGH (ref <5.7)
Mean Plasma Glucose: 151 mg/dL
eAG (mmol/L): 8.4 mmol/L

## 2023-11-14 LAB — PSA: PSA: 0.71 ng/mL (ref <=4.00)

## 2023-11-14 LAB — MICROALBUMIN / CREATININE URINE RATIO
Creatinine, Urine: 77 mg/dL (ref 20–320)
Microalb Creat Ratio: 4 mg/g{creat} (ref <30)
Microalb, Ur: 0.3 mg/dL

## 2023-11-16 NOTE — Progress Notes (Signed)
 Spoke to pt, schedule cpe for 12/10/23

## 2023-12-03 ENCOUNTER — Other Ambulatory Visit: Payer: Self-pay | Admitting: Internal Medicine

## 2023-12-10 ENCOUNTER — Ambulatory Visit: Admitting: Internal Medicine

## 2023-12-10 ENCOUNTER — Encounter: Payer: Self-pay | Admitting: Internal Medicine

## 2023-12-10 VITALS — BP 114/70 | HR 70 | Temp 98.4°F | Ht 75.0 in | Wt 229.0 lb

## 2023-12-10 DIAGNOSIS — Z0000 Encounter for general adult medical examination without abnormal findings: Secondary | ICD-10-CM | POA: Diagnosis not present

## 2023-12-10 DIAGNOSIS — E1159 Type 2 diabetes mellitus with other circulatory complications: Secondary | ICD-10-CM

## 2023-12-10 DIAGNOSIS — I10 Essential (primary) hypertension: Secondary | ICD-10-CM | POA: Diagnosis not present

## 2023-12-10 MED ORDER — METFORMIN HCL ER 500 MG PO TB24: 500.0000 mg | ORAL_TABLET | Freq: Every day | ORAL | 3 refills | Status: DC

## 2023-12-10 NOTE — Progress Notes (Signed)
 Subjective:    Patient ID: Hunter Richmond, male    DOB: 24-Jan-1961, 63 y.o.   MRN: [id number]  HPI Here for physical  Upset about gaining some weight Is still be careful with sugars----but snacks more A1c up slightly to 6.9% No medication at this point Using Libre----60-70% in green, rare lows  Researched pravastatin and other statins--gallbladder, etc Taking Dr Burnard Leigh salts (internet) and foot itching is better  Same job but still 3rd shift Some trouble initiating sleep after shift Still exercises regularly  Current Outpatient Medications on File Prior to Visit  Medication Sig Dispense Refill   amLODipine (NORVASC) 10 MG tablet Take 1 tablet by mouth once daily 90 tablet 0   BAYER LOW DOSE 81 MG EC tablet Take 81-162 mg by mouth every 6 (six) hours as needed for pain (or headaches). Swallow whole.     Continuous Glucose Sensor (FREESTYLE LIBRE 2 SENSOR) MISC INJECT 1 DEVICE INTO THE SKIN EVERY 14 DAYS 2 each 12   glucose blood (ONETOUCH ULTRA) test strip USE  STRIP TO CHECK GLUCOSE ONCE DAILY 100 each 3   ibuprofen (ADVIL) 200 MG tablet Take 200-400 mg by mouth every 6 (six) hours as needed for mild pain or headache.     ibuprofen (ADVIL) 600 MG tablet Take 600 mg by mouth.     lisinopril-hydrochlorothiazide (ZESTORETIC) 20-25 MG tablet Take 1 tablet by mouth once daily 90 tablet 0   pravastatin (PRAVACHOL) 20 MG tablet Take 1 tablet by mouth once daily 90 tablet 0   No current facility-administered medications on file prior to visit.    No Known Allergies  Past Medical History:  Diagnosis Date   Cataract    not a surgical candidate at this time (10/20/2022)   Diabetes mellitus    GERD (gastroesophageal reflux disease)    no issues for years    Hyperlipidemia    controlled with pravastatin    Hypertension    controlled last 2-3 years    Past Surgical History:  Procedure Laterality Date   COLONOSCOPY  07/07/2019   COLONOSCOPY     FINGER SURGERY  1979   torn  ligament   POLYPECTOMY     WISDOM TOOTH EXTRACTION  1989    Family History  Problem Relation Age of Onset   Diabetes Sister    Diabetes Brother    Heart attack Brother    Cancer Maternal Aunt        breast cancer   Cancer Maternal Uncle        throat   Heart disease Neg Hx    Colon cancer Neg Hx    Esophageal cancer Neg Hx    Rectal cancer Neg Hx    Stomach cancer Neg Hx    Colon polyps Neg Hx     Social History   Socioeconomic History   Marital status: Married    Spouse name: Not on file   Number of children: 1   Years of education: Not on file   Highest education level: Not on file  Occupational History   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   Occupation:     Occupation:    Tobacco Use   Smoking status: Never    Passive exposure: Never   Smokeless tobacco: Never  Vaping Use   Vaping status: Never Used  Substance and Sexual Activity   Alcohol use: Never   Drug use: Never   Sexual activity: Yes  Other Topics  Concern   Not on file  Social History Narrative   [employer]-- vegetarian   Social Drivers of Corporate investment banker Strain: Not on file  Food Insecurity: Not on file  Transportation Needs: Not on file  Physical Activity: Not on file  Stress: Not on file  Social Connections: Unknown (01/27/2022)   Received from [facility], [facility]   Social Network    Social Network: Not on file  Intimate Partner Violence: Unknown (12/19/2021)   Received from [facility], [facility]   HITS    Physically Hurt: Not on file    Insult or Talk Down To: Not on file    Threaten Physical Harm: Not on file    Scream or Curse: Not on file   Review of Systems  Constitutional:  Positive for unexpected weight change. Negative for fatigue.       Wears seat belt  HENT:  Negative for dental problem, hearing loss, tinnitus and trouble swallowing.        Keeps up with dentist  Eyes:  Negative for visual disturbance.        No diplopia or unilateral vision loss---due for exam  Respiratory:  Negative for cough, chest tightness and shortness of breath.   Cardiovascular:  Negative for chest pain and palpitations.       Mild edema if long time sitting in truck  Gastrointestinal:  Negative for blood in stool and constipation.       Takes juice plus and helps bowels Some heartburn--tums will help (prilosec in the past)  Endocrine: Negative for polydipsia and polyuria.  Genitourinary:  Positive for frequency. Negative for difficulty urinating and urgency.       Libido is down  Musculoskeletal:  Negative for back pain and joint swelling.       Occ right shoulder pain--especially rolling over in bed No Rx  Skin:  Negative for rash.  Allergic/Immunologic: Negative for immunocompromised state.       Mild symptoms in spring  Neurological:  Negative for dizziness, syncope, light-headedness and headaches.       No foot numbness or burning  Hematological:  Negative for adenopathy. Does not bruise/bleed easily.  Psychiatric/Behavioral:  Positive for sleep disturbance. Negative for dysphoric mood. The patient is not nervous/anxious.        Some stress       Objective:   Physical Exam Constitutional:      Appearance: Normal appearance.  HENT:     Mouth/Throat:     Pharynx: No oropharyngeal exudate or posterior oropharyngeal erythema.  Eyes:     Conjunctiva/sclera: Conjunctivae normal.     Pupils: Pupils are equal, round, and reactive to light.  Cardiovascular:     Rate and Rhythm: Normal rate and regular rhythm.     Pulses: Normal pulses.     Heart sounds: No murmur heard.    No gallop.  Pulmonary:     Effort: Pulmonary effort is normal.     Breath sounds: Normal breath sounds. No wheezing or rales.  Abdominal:     Palpations: Abdomen is soft.     Tenderness: There is no abdominal tenderness.  Musculoskeletal:     Cervical back: Neck supple.     Right lower leg: No edema.     Left lower leg: No  edema.  Lymphadenopathy:     Cervical: No cervical adenopathy.  Skin:    Findings: No lesion or rash.     Comments: No foot lesions  Neurological:  General: No focal deficit present.     Mental Status: He is alert and oriented to person, place, and time.     Comments: Normal sensation in feet  Psychiatric:        Mood and Affect: Mood normal.        Behavior: Behavior normal.            Assessment & Plan:

## 2023-12-10 NOTE — Assessment & Plan Note (Signed)
 Healthy Regular exercise Due for flu and COVID vaccines in the fall Recent PSA normal Recent colonoscopy --due again 2029

## 2023-12-10 NOTE — Assessment & Plan Note (Signed)
 BP Readings from Last 3 Encounters:  12/10/23 114/70  05/14/23 128/86  05/04/23 118/73   Good control

## 2023-12-10 NOTE — Assessment & Plan Note (Signed)
 Lab Results  Component Value Date   HGBA1C 6.9 (H) 11/13/2023   Good control without meds Discussed it would probably be good to stay on low dose metformin 500mg  daily On statin--no change

## 2024-03-02 ENCOUNTER — Other Ambulatory Visit: Payer: Self-pay | Admitting: Internal Medicine

## 2024-04-19 ENCOUNTER — Ambulatory Visit: Admitting: Podiatry

## 2024-04-19 VITALS — Ht 75.0 in | Wt 229.0 lb

## 2024-04-19 DIAGNOSIS — E119 Type 2 diabetes mellitus without complications: Secondary | ICD-10-CM | POA: Diagnosis not present

## 2024-04-22 ENCOUNTER — Encounter: Payer: Self-pay | Admitting: Podiatry

## 2024-04-22 NOTE — Progress Notes (Signed)
  Subjective:  Patient ID: Hunter Richmond, male    DOB: [birth year],  MRN: [id number]  Chief Complaint  Patient presents with   Diabetes    Rm 1 Patient is here for diabetic foot exam ) Patient has no additional concerns today.    63 y.o. male presents with the above complaint. History confirmed with patient.   Objective:  Physical Exam: warm, good capillary refill, no trophic changes or ulcerative lesions, normal DP and PT pulses, normal monofilament exam, and normal sensory exam.  Assessment:   1. Encounter for diabetic foot exam (HCC)   2. Type 2 diabetes mellitus without complication, without long-term current use of insulin (HCC)      Plan:  Patient was evaluated and treated and all questions answered.   Patient educated on diabetes. Discussed proper diabetic foot care and discussed risks and complications of disease. Educated patient in depth on reasons to return to the office immediately should he/she discover anything concerning or new on the feet. All questions answered. Discussed proper shoes as well.    Return in about 1 year (around 04/19/2025) for diabetic foot exam.

## 2024-05-29 ENCOUNTER — Encounter: Payer: Self-pay | Admitting: Family Medicine

## 2024-05-29 NOTE — Progress Notes (Signed)
 Tamyrah Burbage T. Tripton Ned, MD, CAQ Sports Medicine [facility] [address] [place], [address]  Phone: [phone]  FAX: [phone]  Hunter Richmond - 63 y.o. male  MRN [id number]  Date of Birth: 1961/06/30  Date: 06/01/2024  PCP: Watt Mirza, MD  Referral: No ref. provider found  Chief Complaint  Patient presents with   Establish Care   Subjective:   Hunter Richmond is a 63 y.o. very pleasant male patient with Body mass index is 28.44 kg/m. who presents with the following:  Discussed the use of AI scribe software for clinical note transcription with the patient, who gave verbal consent to proceed.  This is 1 of Dr. Marval former patients with diabetes who presents as a transfer of care.  Type 2 diabetes, metformin  1 tablets a day prn now.  Hypertension, Zestoretic  20/25 mg.  Norvasc  10 mg  Hyperlipidemia, currently on Pravachol  20 mg. History of Present Illness Hunter Richmond is a 63 year old male with diabetes and hypertension who presents for a transition of care visit.  He has a history of diabetes, primarily managed through lifestyle modifications such as exercise. He was off all blood sugar medications for about three years but resumed metformin  on a PRN basis after his blood sugar levels increased. His A1c was previously as high as 12.2% due to personal stressors but has improved significantly. Currently, his A1c is 6.2%.  He takes amlodipine  10 mg and a combination pill of lisinopril  and hydrochlorothiazide  (20 mg/25 mg) for hypertension.  He is on pravastatin  for cholesterol management but experiences a 'horrible foot itch' that he suspects may be related to the medication. The itch occurs infrequently, about once every two weeks, and lasts for 10 to 15 minutes. It primarily affects the bottom of his feet but can also occur on his fingers. He has had a foot exam at a foot and ankle clinic, which showed strong pulses and no  signs of neuropathy.  He is a Naval architect, working regional routes, and maintains physical activity by using a treadmill and Peloton at home. He also visits [employer] with his wife. He has been married for 25 years and has a 34 year old daughter who is a Consulting civil engineer at [employer].  He has a history of finger surgery from an injury in high school but no history of heart problems. He takes a baby aspirin daily and has stopped taking ibuprofen.  FREESTYLE LIBRE 3   Health Maintenance  Topic Date Due   OPHTHALMOLOGY EXAM  11/07/2023   Diabetic kidney evaluation - eGFR measurement  11/12/2024   Diabetic kidney evaluation - Urine ACR  11/12/2024   HEMOGLOBIN A1C  11/29/2024   FOOT EXAM  04/19/2025   Colonoscopy  12/29/2027   DTaP/Tdap/Td (4 - Td or Tdap) 11/11/2032   Pneumococcal Vaccine: 50+ Years  Completed   Influenza Vaccine  Completed   Zoster Vaccines- Shingrix   Completed   Hepatitis B Vaccines 19-59 Average Risk  Aged Out   HPV VACCINES  Aged Out   Meningococcal B Vaccine  Aged Out   COVID-19 Vaccine  Discontinued   Hepatitis C Screening  Discontinued   HIV Screening  Discontinued    Immunization History  Administered Date(s) Administered   Influenza Split 07/23/2011, 05/16/2013   Influenza Whole 07/20/2007, 08/29/2009   Influenza, Seasonal, Injecte, Preservative Fre 06/01/2024   Influenza,inj,Quad PF,6+ Mos 05/08/2014, 06/29/2015, 07/22/2016, 08/26/2017, 08/04/2018, 05/31/2019   Influenza-Unspecified 05/16/2021   PNEUMOCOCCAL  CONJUGATE-20 06/01/2024   PPD Test 12/30/2011   Pneumococcal Conjugate-13 07/22/2016   Pneumococcal Polysaccharide-23 05/08/2014   Td 09/15/2002, 11/11/2022   Tdap 04/15/2013   Unspecified SARS-COV-2 Vaccination 11/14/2019, 12/15/2019, 08/15/2020   Zoster Recombinant(Shingrix ) 08/23/2021, 11/11/2022     Review of Systems is noted in the HPI, as appropriate  Objective:   BP 120/78   Pulse 64   Temp (!) 97.5 F (36.4 C) (Temporal)   Ht 6' 3  (1.905 m)   Wt 227 lb 8 oz (103.2 kg)   SpO2 95%   BMI 28.44 kg/m   GEN: No acute distress; alert,appropriate. CV: RRR, no m/g/r  PULM: Breathing comfortably in no respiratory distress PSYCH: Normally interactive.   Physical Exam MEASUREMENTS: Height- 6'3, Weight- 227.  Laboratory and Imaging Data: Results for orders placed or performed in visit on 06/01/24  POCT glycosylated hemoglobin (Hb A1C)   Collection Time: 06/01/24  2:17 PM  Result Value Ref Range   Hemoglobin A1C 6.2 (A) 4.0 - 5.6 %   HbA1c POC (<> result, manual entry)     HbA1c, POC (prediabetic range)     HbA1c, POC (controlled diabetic range)       Assessment and Plan:     ICD-10-CM   1. Controlled type 2 diabetes mellitus with diabetic neuropathy, without long-term current use of insulin (HCC)  E11.40 POCT glycosylated hemoglobin (Hb A1C)    2. Diabetes mellitus treated with oral medication (HCC)  E11.9    Z79.84     3. Essential hypertension, benign  I10     4. Mixed hyperlipidemia  E78.2     5. Encounter for long-term (current) use of medications  Z79.899 CBC with Differential/Platelet    Basic metabolic panel with GFR    Hepatic function panel    6. Need for influenza vaccination  Z23 Flu vaccine trivalent PF, 6mos and older(Flulaval,Afluria,Fluarix,Fluzone)    7. Need for vaccination against Streptococcus pneumoniae  Z23 Pneumococcal conjugate vaccine 20-valent (Prevnar 20)     Assessment & Plan Type 2 diabetes mellitus without complications A1c improved to 6.2% with lifestyle changes. Off blood sugar medications for three years. - Discontinue metformin . - Order Freestyle Libre 3 system.  Essential hypertension Blood pressure controlled with amlodipine  and lisinopril /hydrochlorothiazide .  Mixed hyperlipidemia Managed with pravastatin . Itching possibly related to pravastatin . Statin benefits outweigh risks. - Continue pravastatin . - Consider using over-the-counter hydrocortisone 1% cream for  itching + TAC  Intermittent foot itching Severe biweekly itching on feet and fingers. No neuropathy or visible rash. - Consider using over-the-counter hydrocortisone 1% cream for itching.  Medication Management during today's office visit: Meds ordered this encounter  Medications   triamcinolone  cream (KENALOG ) 0.1 %    Sig: Apply 1 Application topically 2 (two) times daily.    Dispense:  454 g    Refill:  0   Medications Discontinued During This Encounter  Medication Reason   ibuprofen (ADVIL) 200 MG tablet    lisinopril -hydrochlorothiazide  (ZESTORETIC ) 20-25 MG tablet     Orders placed today for conditions managed today: Orders Placed This Encounter  Procedures   Flu vaccine trivalent PF, 6mos and older(Flulaval,Afluria,Fluarix,Fluzone)   Pneumococcal conjugate vaccine 20-valent (Prevnar 20)   CBC with Differential/Platelet   Basic metabolic panel with GFR   Hepatic function panel   POCT glycosylated hemoglobin (Hb A1C)    Disposition: No follow-ups on file.  Dr[employer] One speech-to-text software was used for transcription in this dictation.  Possible transcriptional errors can occur using Animal nutritionist.  Signed,  Jacques DASEN. Kaycee Mcgaugh, MD   Outpatient Encounter Medications as of 06/01/2024  Medication Sig   amLODipine  (NORVASC ) 10 MG tablet Take 1 tablet by mouth once daily   BAYER LOW DOSE 81 MG EC tablet Take 81-162 mg by mouth every 6 (six) hours as needed for pain (or headaches). Swallow whole.   Continuous Glucose Sensor (FREESTYLE LIBRE 2 SENSOR) MISC INJECT 1 DEVICE INTO THE SKIN EVERY 14 DAYS   glucose blood (ONETOUCH ULTRA) test strip USE  STRIP TO CHECK GLUCOSE ONCE DAILY   ibuprofen (ADVIL) 600 MG tablet Take 600 mg by mouth.   metFORMIN  (GLUCOPHAGE -XR) 500 MG 24 hr tablet Take 1 tablet (500 mg total) by mouth daily with breakfast.   pravastatin  (PRAVACHOL ) 20 MG tablet Take 1 tablet by mouth once daily   triamcinolone  cream (KENALOG ) 0.1 % Apply 1  Application topically 2 (two) times daily.   [DISCONTINUED] ibuprofen (ADVIL) 200 MG tablet Take 200-400 mg by mouth every 6 (six) hours as needed for mild pain or headache.   [DISCONTINUED] lisinopril -hydrochlorothiazide  (ZESTORETIC ) 20-25 MG tablet Take 1 tablet by mouth once daily   No facility-administered encounter medications on file as of 06/01/2024.

## 2024-06-01 ENCOUNTER — Ambulatory Visit: Admitting: Family Medicine

## 2024-06-01 ENCOUNTER — Encounter: Payer: Self-pay | Admitting: Family Medicine

## 2024-06-01 VITALS — BP 120/78 | HR 64 | Temp 97.5°F | Ht 75.0 in | Wt 227.5 lb

## 2024-06-01 DIAGNOSIS — E782 Mixed hyperlipidemia: Secondary | ICD-10-CM

## 2024-06-01 DIAGNOSIS — E1140 Type 2 diabetes mellitus with diabetic neuropathy, unspecified: Secondary | ICD-10-CM | POA: Diagnosis not present

## 2024-06-01 DIAGNOSIS — E119 Type 2 diabetes mellitus without complications: Secondary | ICD-10-CM

## 2024-06-01 DIAGNOSIS — Z7984 Long term (current) use of oral hypoglycemic drugs: Secondary | ICD-10-CM

## 2024-06-01 DIAGNOSIS — Z79899 Other long term (current) drug therapy: Secondary | ICD-10-CM | POA: Diagnosis not present

## 2024-06-01 DIAGNOSIS — I10 Essential (primary) hypertension: Secondary | ICD-10-CM | POA: Diagnosis not present

## 2024-06-01 DIAGNOSIS — Z23 Encounter for immunization: Secondary | ICD-10-CM

## 2024-06-01 LAB — POCT GLYCOSYLATED HEMOGLOBIN (HGB A1C): Hemoglobin A1C: 6.2 % — AB (ref 4.0–5.6)

## 2024-06-01 MED ORDER — TRIAMCINOLONE ACETONIDE 0.1 % EX CREA: 1.0000 | TOPICAL_CREAM | Freq: Two times a day (BID) | CUTANEOUS | 0 refills | Status: DC

## 2024-06-01 MED ORDER — FREESTYLE LIBRE 3 PLUS SENSOR MISC
11 refills | Status: AC
Start: 2024-06-01 — End: ?

## 2024-06-01 NOTE — Addendum Note (Signed)
 Addended by: WENDELL ARLAND RAMAN on: 06/01/2024 03:37 PM   Modules accepted: Orders

## 2024-06-02 LAB — CBC WITH DIFFERENTIAL/PLATELET
Basophils Absolute: 0 K/uL (ref 0.0–0.1)
Basophils Relative: 0.4 % (ref 0.0–3.0)
Eosinophils Absolute: 0.2 K/uL (ref 0.0–0.7)
Eosinophils Relative: 3.3 % (ref 0.0–5.0)
HCT: 43.6 % (ref 39.0–52.0)
Hemoglobin: 14.6 g/dL (ref 13.0–17.0)
Lymphocytes Relative: 40.9 % (ref 12.0–46.0)
Lymphs Abs: 2.2 K/uL (ref 0.7–4.0)
MCHC: 33.6 g/dL (ref 30.0–36.0)
MCV: 96 fl (ref 78.0–100.0)
Monocytes Absolute: 0.4 K/uL (ref 0.1–1.0)
Monocytes Relative: 6.9 % (ref 3.0–12.0)
Neutro Abs: 2.6 K/uL (ref 1.4–7.7)
Neutrophils Relative %: 48.5 % (ref 43.0–77.0)
Platelets: 237 K/uL (ref 150.0–400.0)
RBC: 4.54 Mil/uL (ref 4.22–5.81)
RDW: 12.2 % (ref 11.5–15.5)
WBC: 5.4 K/uL (ref 4.0–10.5)

## 2024-06-02 LAB — HEPATIC FUNCTION PANEL
ALT: 15 U/L (ref 0–53)
AST: 18 U/L (ref 0–37)
Albumin: 4.5 g/dL (ref 3.5–5.2)
Alkaline Phosphatase: 60 U/L (ref 39–117)
Bilirubin, Direct: 0.1 mg/dL (ref 0.0–0.3)
Total Bilirubin: 0.7 mg/dL (ref 0.2–1.2)
Total Protein: 7.2 g/dL (ref 6.0–8.3)

## 2024-06-02 LAB — BASIC METABOLIC PANEL WITH GFR
BUN: 16 mg/dL (ref 6–23)
CO2: 30 meq/L (ref 19–32)
Calcium: 9.7 mg/dL (ref 8.4–10.5)
Chloride: 100 meq/L (ref 96–112)
Creatinine, Ser: 1.05 mg/dL (ref 0.40–1.50)
GFR: 76.04 mL/min (ref >60.00)
Glucose, Bld: 112 mg/dL — ABNORMAL HIGH (ref 70–99)
Potassium: 3.7 meq/L (ref 3.5–5.1)
Sodium: 136 meq/L (ref 135–145)

## 2024-06-06 ENCOUNTER — Ambulatory Visit: Payer: Self-pay | Admitting: Family Medicine

## 2024-07-15 ENCOUNTER — Telehealth: Payer: Self-pay

## 2024-07-15 NOTE — Telephone Encounter (Signed)
 Advised patient to send us  a MyChart message and let us  know what insurance says regarding this.

## 2024-07-15 NOTE — Telephone Encounter (Signed)
 Copied from CRM [id number]. Topic: General - Other >> Jul 15, 2024  1:44 PM Hunter Richmond wrote: Reason for CRM: Patient got off the phone with insurance as he is trying to get set up with a life insurance plan, and they stated how the patient has neuropathy, but patient does not recall being diagnosed with this issue. Patient would like to speak with PCP's nurse about this. Please call patient when available.

## 2024-07-15 NOTE — Telephone Encounter (Signed)
 Called patient and went through his chart with him and I could not find anything neuropathy related. Patient going to call insurance so we can get this figured out to see if we could do anything further to help.

## 2024-09-13 ENCOUNTER — Other Ambulatory Visit (HOSPITAL_COMMUNITY): Payer: Self-pay

## 2024-09-13 ENCOUNTER — Other Ambulatory Visit: Payer: Self-pay | Admitting: Family Medicine

## 2024-09-14 NOTE — Telephone Encounter (Signed)
 Last office visit 06/01/24 for establish care.  Last refilled 06/01/24 for 454 g with no refills. Next appt: No future appointments with PCP.

## 2024-10-03 ENCOUNTER — Other Ambulatory Visit: Payer: Self-pay | Admitting: Family Medicine

## 2024-10-03 MED ORDER — METFORMIN HCL ER 500 MG PO TB24: 500.0000 mg | ORAL_TABLET | Freq: Every day | ORAL | 1 refills | Status: AC

## 2025-04-18 ENCOUNTER — Ambulatory Visit: Admitting: Podiatry
